# Patient Record
Sex: Female | Born: 1975 | Race: White | Hispanic: Yes | Marital: Single | Smoking: Never smoker
Health system: Southern US, Community
[De-identification: ages and names within clinical notes are randomized; demographics above are authoritative.]

## PROBLEM LIST (undated history)

## (undated) DIAGNOSIS — K219 Gastro-esophageal reflux disease without esophagitis: Secondary | ICD-10-CM

## (undated) DIAGNOSIS — G709 Myoneural disorder, unspecified: Secondary | ICD-10-CM

## (undated) HISTORY — DX: Myoneural disorder, unspecified: G70.9

## (undated) HISTORY — PX: NO PAST SURGERIES: SHX2092

---

## 2003-12-18 ENCOUNTER — Emergency Department (HOSPITAL_COMMUNITY): Admission: EM | Admit: 2003-12-18 | Discharge: 2003-12-19 | Payer: Self-pay | Admitting: Emergency Medicine

## 2004-10-24 ENCOUNTER — Ambulatory Visit: Payer: Self-pay | Admitting: Family Medicine

## 2004-11-02 ENCOUNTER — Encounter: Admission: RE | Admit: 2004-11-02 | Discharge: 2004-11-02 | Payer: Self-pay | Admitting: Sports Medicine

## 2006-12-06 ENCOUNTER — Ambulatory Visit (HOSPITAL_COMMUNITY): Admission: RE | Admit: 2006-12-06 | Discharge: 2006-12-06 | Payer: Self-pay | Admitting: Obstetrics and Gynecology

## 2007-03-13 ENCOUNTER — Ambulatory Visit (HOSPITAL_COMMUNITY): Admission: RE | Admit: 2007-03-13 | Discharge: 2007-03-13 | Payer: Self-pay | Admitting: Family Medicine

## 2007-04-23 ENCOUNTER — Ambulatory Visit: Payer: Self-pay | Admitting: Advanced Practice Midwife

## 2007-04-23 ENCOUNTER — Inpatient Hospital Stay (HOSPITAL_COMMUNITY): Admission: AD | Admit: 2007-04-23 | Discharge: 2007-04-25 | Payer: Self-pay | Admitting: Gynecology

## 2011-04-12 LAB — RPR: RPR Ser Ql: NONREACTIVE

## 2011-04-12 LAB — CBC
HCT: 37.8
Hemoglobin: 12.8
MCHC: 33.9
MCV: 82.4
Platelets: 286
RBC: 4.59
RDW: 15.5 — ABNORMAL HIGH
WBC: 16 — ABNORMAL HIGH

## 2014-04-10 ENCOUNTER — Ambulatory Visit (INDEPENDENT_AMBULATORY_CARE_PROVIDER_SITE_OTHER): Payer: Managed Care, Other (non HMO) | Admitting: Internal Medicine

## 2014-04-10 VITALS — BP 119/74 | HR 82 | Temp 98.6°F | Resp 18 | Wt 150.0 lb

## 2014-04-10 DIAGNOSIS — R202 Paresthesia of skin: Secondary | ICD-10-CM

## 2014-04-10 DIAGNOSIS — E785 Hyperlipidemia, unspecified: Secondary | ICD-10-CM

## 2014-04-10 DIAGNOSIS — M25511 Pain in right shoulder: Secondary | ICD-10-CM

## 2014-04-10 MED ORDER — CYCLOBENZAPRINE HCL 10 MG PO TABS: 10.0000 mg | ORAL_TABLET | Freq: Every day | ORAL | Status: DC

## 2014-04-10 MED ORDER — PREDNISONE 20 MG PO TABS: ORAL_TABLET | ORAL | Status: DC

## 2014-04-10 MED ORDER — MELOXICAM 15 MG PO TABS: 15.0000 mg | ORAL_TABLET | Freq: Every day | ORAL | Status: DC

## 2014-04-10 NOTE — Patient Instructions (Addendum)
Tendinitis del manguito rotador  (Rotator Cuff Tendinitis ) La tendinitis del manguito rotador es la inflamacin de las bandas duras, de aspecto de cordn, que conectan el msculo a los huesos (tendones) en el manguito rotador. El manguito rotador es el conjunto de todos los msculos y tendones que conectan el brazo al hombro. El manguito rotador sostiene la cabeza del hueso del brazo (hmero) en el hueco (fosa) del omplato (escpula). CAUSAS Con frecuencia, la tendinitis del manguito rotador se origina con el uso excesivo de la articulacin involucrada. . Make sure you discuss any questions you have with your health care provider.  SIGNOS Y SNTOMAS  Dolor intenso en el hombro, que se extiende a la parte externa del brazo sobre el msculo del hombro.  Punto de sensibilidad en el rea lesionada.  El dolor aparece gradualmente y empeora al levantar el brazo hacia un lado (abduccin) o al llevarlo Normajean Glasgowhacia adentro (rotacin interna).  Puede producir un desgarro crnico: Cuando el tendn del manguito rotador se inflama, aparece el riesgo de no recibir Designer, fashion/clothingel suministro de Retail buyersangre y Conservator, museum/galleryesto provoca la muerte de las fibras de los tendones. Esto aumenta el riesgo de que el tendn se deshilache o se desgarre por completo. DIAGNSTICO La tendinitis del manguito rotador se diagnostica despus de Automotive engineerhacer la historia clnica, un examen fsico y la revisin de los [place] Lakeresultados de los estudios de diagnstico por imgenes. La historia clnica ayuda a determinar el tipo de lesin en el manguito rotador. El examen fsico incluir evaluar el hombro lesionado, palpar el rea y observarlo mientras hace ejercicios para determinar el rango de Sardismovimiento. Generalmente, se realizan radiografas para descartar otras causas del dolor en el hombro, como fracturas. Suele realizarse una resonancia magntica cuando la lesin en el hombro es significativa. A veces, se realiza un estudio con contraste llamado artrograma por tomografa  computarizada, pero no es tan frecuente como la Health visitorresonancia magntica. En algunas instituciones, tambin pueden usarse ecografas especiales para ayudar a Orthoptistestablecer el diagnstico. TRATAMIENTO  Casos menos graves  Usar un cabestrillo para descansar el hombro durante un breve perodo. El uso prolongado del cabestrillo puede producir rigidez, debilidad y prdida del movimiento de la articulacin del hombro.  Es posible que le receten antinflamatorios, como ibuprofeno o naproxeno. Casos ms graves  Fisioterapia.  Inyecciones de corticoides en la articulacin del hombro.  Ciruga. INSTRUCCIONES PARA EL CUIDADO EN EL HOGAR   Use un cabestrillo o una frula hasta que mejore el dolor. El uso prolongado del cabestrillo puede producir rigidez, debilidad y prdida del movimiento de la articulacin del hombro.  Aplique hielo sobre la zona lesionada.  Ponga el hielo en una bolsa plstica.  Colquese una toalla entre la piel y la bolsa de hielo.  Deje el hielo durante 20 minutos, 2 a 3 veces por da.  Mientras el tendn le cause dolor, evite todo rango de movimiento que no sea moderado. Use el hombro y haga ejercicios solamente segn las indicaciones de su mdico. Suspenda los ejercicios o reduzca el rango de movimiento si el dolor o las molestias Carlinaumentan, a menos que el mdico le indique otra cosa.  Utilice los medicamentos de venta libre o recetados para Primary school teachercalmar el dolor, el malestar o la fiebre, segn se lo indique el mdico.  Si le han inmovilizado el brazo (con un cabestrillo y tirantes), no los retire, excepto que su mdico se lo haya indicado o hasta que vea a su mdico para la visita de control. Si necesita quitarlos, mueva el brazo lo  menos posible.  Podr dormir sobre varias almohadas para disminuir la hinchazn y Chief Technology Officer. SOLICITE ATENCIN MDICA DE INMEDIATO SI:   El dolor en el hombro [place], o siente un nuevo dolor en el brazo, en la mano o en los dedos que no se alivia con  medicamentos.  Presenta sntomas nuevos o desconocidos, especialmente mayor adormecimiento en las manos o prdida de fuerza.  Empeoran los problemas que lo llevaron a la consulta con el mdico.  El brazo, la mano o los dedos estn adormecidos o siente hormigueos.  El brazo, la mano o los dedos estn hinchados, le duelen o se ven blancos o azules ASEGRESE DE QUE:  Comprende estas instrucciones.  Controlar su afeccin.  Recibir ayuda de inmediato si no mejora o si empeora. Document Released: 10/05/2008 Document Revised: 04/09/2013 [facility] Patient Information 2015 [place], [place]. This information is not intended to replace advice given to you by your health care provider. Make sure you discuss any questions you have with your health care provider.    Ejercicios de rango de movimiento del hombro (Shoulder Range of Motion Exercises) El hombro es la articulacin ms flexible del cuerpo humano. Por esta misma razn, es la articulacin ms inestable del cuerpo. Pueden aparecer problemas en el hombro a cualquier edad. Es necesario el tratamiento temprano de estos problemas para obtener un buen resultado. La reaccin de las personas con dolor en el hombro es reducir el movimiento de Nurse, learning disability. Despus de un breve perodo, el hombro puede "congelarse". Esto significa una prdida casi completa de la capacidad para mover el hombro lesionado. Despus de una lesin, el mdico le indicar ejercicios para mantener el rango de movimiento (capacidad para mover libremente el hombro) o para Education officer, community, en caso de haber tenido una reduccin del rango.  EJERCICIOS EJERCICIOS PARA MANTENER LA MOVILIDAD DEL HOMBRO: Ejercicio de Estate agent o del pndulo  Puede hacer este ejercicio en posicin de decbito prono (acostado sobre el vientre) o de pie con el brazo opuesto apoyado sobre una silla. El objetivo es International aid/development worker los msculos del hombro y aumentar el rango de movimiento en forma lenta y segura, a la vez  que se Research scientist (life sciences).  Acustese sobre el vientre, cerca del borde lateral de la cama. Cuelgue el brazo [employer] borde de la cama. Relaje el hombro, el brazo y Engineer, site. Deje que el omplato se relaje y descienda.  Balancee el brazo hacia adelante y [place] atrs en forma lenta y [place]. No use los msculos del cuello, mantngalos relajados. Podra ser ms fcil si otra persona lo ayuda para comenzar a Conservation officer, historic buildings brazo suavemente.  A medida que el dolor disminuye, incremente al movimiento de balanceo. En un principio, balancee el brazo en un ngulo de 15grados. Con [employer], y a medida que [address] dolor, mueva el brazo en un ngulo de [id number]. Comience balanceando el brazo durante unos 15segundos y vaya subiendo el tiempo hasta ejercitar durante 3a .  Tambin puede [employer] ejercicio de pie o inclinado.  Pngase de pie y sostngase en una silla maciza con el brazo sano. Inclnese hacia adelante a la altura de la cintura y doble levemente las rodillas para proteger la espalda. Relaje el brazo lesionado hasta que cuelgue flcidamente. Relaje el omplato y permita que descienda.  Mantenga el hombro relajado y use el movimiento del cuerpo para balancear el brazo en pequeos crculos.  Prese con la cabeza en alto y reljese.  Repita el movimiento y [place] la direccin de  los crculos.  Comience balanceando el brazo durante unos 30segundos y vaya subiendo el tiempo hasta ejercitar durante 3a 5minutos. EJERCICIOS DE ESTIRAMIENTO:  Levante el brazo hacia adelante con el codo doblado a 90grados. Use el otro brazo para tirar suavemente del codo hacia adelante y cruzando el cuerpo.  Doble un brazo por la espalda con la palma hacia afuera. Con el otro brazo, sostenga una toalla o soga y levante el brazo por encima de la cabeza; luego doble el codo para llevar la mueca detrs del cuello. Tome el extremo libre de la toalla con la mano por detrs de la espalda. Tire  suavemente de la toalla hacia arriba con la mano detrs del cuello y aumente el tirn gradualmente con la mano en la parte baja de la espalda. Despus tire gradualmente hacia abajo con la mano en la parte baja de la espalda. As se estirarn an ms la mano y el brazo detrs del cuello. Ambos hombros lograrn un mayor rango de movimiento con la repeticin de este ejercicio. EJERCICIOS DE FORTALECIMIENTO:  De pie, con el brazo estirado a un costado y y el codo doblado a 90grados, sostenga una pesa pequea y levante la mano lentamente apuntando [place]hacia arriba. En un principio, repita este movimiento cinco veces y aumente gradualmente hasta llegar a diez veces. Realice esto cuatro veces por da. A medida que tenga ms fuerza, aumente Tyson Foodsgradualmente el peso.  Repita este ejercicio, pero esta vez use una banda elstica. Comience con la mano levantada y tire hacia abajo hasta que la mano est a un costado. A medida que tenga ms fuerza, aumente gradualmente la fuerza que [place], ya sea usando ms bandas elsticas o con una banda de mayor tamao. Haga la misma cantidad de repeticiones.  De pie, con la mano a un costado y sosteniendo una pesa, levante una mano gradualmente hacia adelante hasta que supere la cabeza. Repita este ejercicio tambin con la mano a un costado, levntela alejndola del cuerpo hasta que supere la cabeza. En un principio, repita este movimiento cinco veces y aumente gradualmente hasta llegar a diez veces. Realice esto cuatro veces por da. A medida que tenga ms fuerza, aumente Tyson Foodsgradualmente el peso. Document Released: 04/09/2013 Document Revised: 06/24/2013 [facility] Patient Information 2015 [place], [place]. This information is not intended to replace advice given to you by your health care provider. Make sure you discuss any questions you have with your health care provider.

## 2014-04-10 NOTE — Progress Notes (Addendum)
Subjective:    Patient ID: Julie Herman, female    DOB: 10/30/1975, 38 y.o.   MRN: [id number] This chart was scribed for Ellamae Siaobert Emelina Hinch, MD by Littie Deedsichard Sun, Medical Scribe. This patient was seen in Room 5 and the patient's care was started at 3:30 PM.   Patient history translated by relative HPI HPI Comments: Julie Herman is a 38 y.o. female who presents to the [facility] complaining of gradual onset, constant right shoulder pain that radiates down her arm and neck that began a few days ago. Patient says the pain started in her elbow. She also notes numbness in her fingers of the right hand. For her job, she moves things around constantly. She is able to work with the pain, but she states the pain is worse at night when not moving. Patient has been taking Ibuprofen for the pain, but without relief to symptoms. She notes that her right arm feels numb and tight, as if it is about to burst, after waking up and that she has not been able to brush her teeth or eat; however, she says she her shoulder is better after relaxing it for a bit and then she can carry on with her daily activities. She notes having an injury in her right thumb 1 year ago that she still has pain from. Patient does not recall any injuries to her shoulder in the past week.   Patient has hx of HL, which she takes medication for. She has seen a cardiologist in another network before, who has told her that her blood is thicker than normal. She was sent to an orthopedic specialist by her work, but she was told by the orthopedic specialist to see a PCP first.  On a ? statin  Review of Systems  Constitutional: Negative for fever, activity change, appetite change, fatigue and unexpected weight change.  Eyes: Negative for visual disturbance.  Respiratory: Negative for choking, chest tightness, shortness of breath and wheezing.   Cardiovascular: Negative for palpitations and leg swelling.  Gastrointestinal:  Negative for abdominal pain.        Objective:   Physical Exam  Nursing note and vitals reviewed. Constitutional: She is oriented to person, place, and time. She appears well-developed and well-nourished. No distress.  HENT:  Head: Normocephalic and atraumatic.  Mouth/Throat: Oropharynx is clear and moist. No oropharyngeal exudate.  Eyes: Pupils are equal, round, and reactive to light.  Neck: Normal range of motion. Neck supple.  Pulmonary/Chest: Effort normal.  Musculoskeletal: She exhibits tenderness. She exhibits no edema.  Tenderness to palpation in the right trapezius, right scapular border, right rotator cuff generally, right bicipital groove; range of motion good except pain with external rotation and abduction with resistance; ROM elbow is normal without tenderness; grip is full; there is tenderness at the first [facility] joint without chronic changes and ROM is good  Neurological: She is alert and oriented to person, place, and time. No cranial nerve deficit.  Skin: Skin is warm and dry. No rash noted.  Psychiatric: She has a normal mood and affect. Her behavior is normal.          Assessment & Plan:    Hyperlipidemia by hx  Right shoulder pain-nocturnal mainly, with transient Paresthesias---RC tendonitis suspected  Meds ordered this encounter  Medications  . meloxicam (MOBIC) 15 MG tablet    Sig: Take 1 tablet (15 mg total) by mouth daily.    Dispense:  30 tablet    Refill:  0  . cyclobenzaprine (FLEXERIL) 10 MG tablet    Sig: Take 1 tablet (10 mg total) by mouth at bedtime.    Dispense:  30 tablet    Refill:  0  . predniSONE (DELTASONE) 20 MG tablet    Sig: 3/3/2/2/1/1 single daily dose for 6 days for inflammation    Dispense:  12 tablet    Refill:  0   Exercises given If not better=PT and then if no response, xrays or ref to ortho for US or xrays and further rx Will schedule f/u 3 weeks at 89 she prefers spanish speaking provider She is to bring her labs for  further review and the med she is on

## 2014-08-01 ENCOUNTER — Ambulatory Visit (INDEPENDENT_AMBULATORY_CARE_PROVIDER_SITE_OTHER): Payer: Managed Care, Other (non HMO) | Admitting: Family Medicine

## 2014-08-01 VITALS — BP 106/68 | HR 64 | Temp 98.3°F | Ht 60.0 in | Wt 149.0 lb

## 2014-08-01 DIAGNOSIS — IMO0002 Reserved for concepts with insufficient information to code with codable children: Secondary | ICD-10-CM

## 2014-08-01 DIAGNOSIS — M7712 Lateral epicondylitis, left elbow: Secondary | ICD-10-CM

## 2014-08-01 DIAGNOSIS — M2550 Pain in unspecified joint: Secondary | ICD-10-CM

## 2014-08-01 DIAGNOSIS — N3000 Acute cystitis without hematuria: Secondary | ICD-10-CM

## 2014-08-01 DIAGNOSIS — M791 Myalgia: Secondary | ICD-10-CM

## 2014-08-01 DIAGNOSIS — K219 Gastro-esophageal reflux disease without esophagitis: Secondary | ICD-10-CM

## 2014-08-01 DIAGNOSIS — N941 Dyspareunia: Secondary | ICD-10-CM

## 2014-08-01 DIAGNOSIS — IMO0001 Reserved for inherently not codable concepts without codable children: Secondary | ICD-10-CM

## 2014-08-01 DIAGNOSIS — Z0000 Encounter for general adult medical examination without abnormal findings: Secondary | ICD-10-CM

## 2014-08-01 DIAGNOSIS — M609 Myositis, unspecified: Secondary | ICD-10-CM

## 2014-08-01 LAB — CBC
HCT: 39.5 % (ref 36.0–46.0)
Hemoglobin: 13.3 g/dL (ref 12.0–15.0)
MCH: 27.6 pg (ref 26.0–34.0)
MCHC: 33.7 g/dL (ref 30.0–36.0)
MCV: 82 fL (ref 78.0–100.0)
MPV: 10.5 fL (ref 8.6–12.4)
Platelets: 349 10*3/uL (ref 150–400)
RBC: 4.82 MIL/uL (ref 3.87–5.11)
RDW: 14.2 % (ref 11.5–15.5)
WBC: 8.3 10*3/uL (ref 4.0–10.5)

## 2014-08-01 LAB — COMPREHENSIVE METABOLIC PANEL
ALT: 24 U/L (ref 0–35)
AST: 21 U/L (ref 0–37)
Albumin: 3.9 g/dL (ref 3.5–5.2)
Alkaline Phosphatase: 86 U/L (ref 39–117)
BUN: 9 mg/dL (ref 6–23)
CO2: 24 mEq/L (ref 19–32)
Calcium: 9.3 mg/dL (ref 8.4–10.5)
Chloride: 103 mEq/L (ref 96–112)
Creat: 0.59 mg/dL (ref 0.50–1.10)
Glucose, Bld: 78 mg/dL (ref 70–99)
Potassium: 4.4 mEq/L (ref 3.5–5.3)
Sodium: 136 mEq/L (ref 135–145)
Total Bilirubin: 0.7 mg/dL (ref 0.2–1.2)
Total Protein: 7.3 g/dL (ref 6.0–8.3)

## 2014-08-01 LAB — POCT URINALYSIS DIPSTICK
Bilirubin, UA: NEGATIVE
Glucose, UA: NEGATIVE
Ketones, UA: NEGATIVE
Leukocytes, UA: NEGATIVE
Nitrite, UA: NEGATIVE
Protein, UA: NEGATIVE
Spec Grav, UA: 1.02
Urobilinogen, UA: 0.2
pH, UA: 6

## 2014-08-01 LAB — POCT UA - MICROSCOPIC ONLY
Bacteria, U Microscopic: NEGATIVE
Casts, Ur, LPF, POC: NEGATIVE
Crystals, Ur, HPF, POC: NEGATIVE
Mucus, UA: NEGATIVE
Yeast, UA: NEGATIVE

## 2014-08-01 LAB — POCT WET PREP WITH KOH
KOH Prep POC: NEGATIVE
Trichomonas, UA: NEGATIVE
Yeast Wet Prep HPF POC: NEGATIVE

## 2014-08-01 LAB — VITAMIN B12: Vitamin B-12: 493 pg/mL (ref 211–911)

## 2014-08-01 LAB — LIPID PANEL
Cholesterol: 197 mg/dL (ref 0–200)
HDL: 49 mg/dL (ref >39)
LDL Cholesterol: 107 mg/dL — ABNORMAL HIGH (ref 0–99)
Total CHOL/HDL Ratio: 4 Ratio
Triglycerides: 203 mg/dL — ABNORMAL HIGH (ref <150)
VLDL: 41 mg/dL — ABNORMAL HIGH (ref 0–40)

## 2014-08-01 LAB — C-REACTIVE PROTEIN: CRP: <0.5 mg/dL (ref <0.60)

## 2014-08-01 LAB — POCT GLYCOSYLATED HEMOGLOBIN (HGB A1C): Hemoglobin A1C: 5.5

## 2014-08-01 LAB — TSH: TSH: 0.687 u[IU]/mL (ref 0.350–4.500)

## 2014-08-01 MED ORDER — NAPROXEN 500 MG PO TABS: 500.0000 mg | ORAL_TABLET | Freq: Two times a day (BID) | ORAL | Status: DC

## 2014-08-01 MED ORDER — OMEPRAZOLE 40 MG PO CPDR: 40.0000 mg | DELAYED_RELEASE_CAPSULE | Freq: Every day | ORAL | Status: DC

## 2014-08-01 MED ORDER — CYCLOBENZAPRINE HCL 10 MG PO TABS: 10.0000 mg | ORAL_TABLET | Freq: Every day | ORAL | Status: DC

## 2014-08-01 NOTE — Patient Instructions (Addendum)
Clarinda (Health Maintenance) Adoptar un estilo de vida saludable y recibir atencin preventiva pueden ser de suma utilidad para promover la salud y Musician. Hable con el mdico para saber cul es el esquema de exmenes peridicos adecuado para usted. Esta es una buena oportunidad para Teacher, adult education peridicamente al mdico sobre cmo prevenir enfermedades y [place] sano. Entre cada control mdico, hay muchas cosas que puede hacer por s solo. Los expertos han investigado mucho acerca de los cambios en el estilo de vida y las medidas preventivas que muy probablemente preserven su salud. Consulte al mdico para obtener ms informacin. EL PESO Y LA DIETA  Consuma una dieta saludable.  Incluya abundante cantidad de verduras, frutas, productos lcteos descremados y protenas magras.  No coma muchos alimentos con alto contenido de grasas slidas, azcares agregados o sal.  Realice actividad fsica con regularidad. Esta es una de las cosas ms importantes que puede hacer por su salud.  La State Farm de las personas adultas deben hacer actividad fsica durante por lo menos 140mnutos semanales. El ejercicio debe aumentar la frecuencia cardaca y hNature conservation officersudar (ejercicio de intensidad moderada).  Adems, casi todos los adultos deben hacer ejercicios de fortalecimiento al mToysRusveces por semana como complemento del ejercicio de iDante Mantenga un peso saludable.  El ndice de masa corporal ([facility] es una medida que puede usarse para identificar posibles problemas relacionados con el peso. Este ofrece un clculo estimativo de la gAir traffic controlleren funcin del peso y lAgricultural consultant El mdico puede determinar su IEncino Outpatient Surgery Center LLCy ayudarlo a aScience writery mTheatre managerun peso saludable.  Para las mujeres mayores de 20aos:  Un ISurgicare Surgical Associates Of Mahwah LLCmenor de 18,5 se considera bajo peso.  Un IBaptist Health Medical Center - North Little Rockentre 18,5 y 24,9 es normal.  Un ITerrell State Hospitalentre 25 y 29,9 es sobrepeso.  Un IMC de 30 o ms se considera  obesidad. Controlar los niveles de colesterol y lpidos en la sangre  Debe comenzar a hacerse anlisis de sangre para controlar los nAscutneyde lpidos y colesterol a partir de lSedalia y repetir estos estudios cada 5aos.  Tal vez deba someterse a controles de los niveles de colesterol con ms frecuencia si:  Tiene los niveles de lpidos o colesterol elevados.  Es mayor de 50aos.  Tiene un riesgo alto de tener enfermedades cardacas. DETECCIN DE CNCER  Cncer de pulmn  Se recomienda realizar exmenes de deteccin de cncer de pulmn a las personas adultas que tienen entre 581y 80aos, y corren riesgo de tBest boycncer de pulmn debido a sus antecedentes de tabaquismo.  Se recomienda realizar una tomografa computarizada anual de baja dosis de los pulmones a las personas que:  Siguen fumando.  Hayan dejado de fumar en los ltimos 15aos.  Hayan fumado un paquete diario durante 30aos. El ndice ao-paquete equivale a fumar, en promedio, un paquete de cigarrillos diario durante 1ao.  Debe seguir realizndose estudios de deteccin anual hasta que hayan pasado 15aos desde que dej de fumar.  Estos estudios deben suspenderse si tiene un problema de salud que le impedira recibir tratamiento para eScience writerde pulmn. Cncer de mama  Ponga en prctica la "autoconciencia de las mamas". Esto significa reconocer la apariencia normal de las mamas y cSt. [place]  Adems implica realizarse autoexmenes peridicos de lJohnson & Johnson Informe al mdico si hay algn cambio, sin importar cun pequeo sea.  Si tiene entre 20 y 30aos, un mdico debe hacerle un examen clnico de las mamas cada 1 a 316aoscomo parte del  examen habitual de salud.  Si es mayor de 40aos, debe Electrical engineer un examen clnico de las Microsoft. Tambin debe considerar la posibilidad de realizarse una radiografa de las mamas ([place]) todos los [place].  Si tiene antecedentes familiares de cncer de  mama, hable con el mdico para saber si debe someterse a un estudio gentico.  Si tiene un riesgo alto de Animal nutritionist de mama, hable con el mdico para saber si debe hacerse una resonancia magntica y 3M Company.  Se recomienda una evaluacin del gen del cncer de mama (BRCA) a las mujeres que tengan familiares con tumores malignos relacionados con el BRCA. Los tumores malignos relacionados con elBRCA incluyen:  [employer]state.  Los Avaya.  Los tumores malignos del peritoneo.  Los resultados de la evaluacin determinarn la necesidad de asesoramiento gentico y de [place] de BRCA1 y BRCA2. Cncer de cuello uterino Ya no se recomiendan los exmenes plvicos de rutina para la deteccin del cncer de cuello uterino en las mujeres que no estn embarazadas que son consideradas sujetos de bajo riesgo de Best boy cncer de los rganos de la pelvis (ovarios, tero y vagina) y que no tienen sntomas. Tal vez sea necesario realizar un examen plvico si tiene sntomas, incluidos aquellos que estn asociados con infecciones en la pelvis. Pregntele al mdico si un examen plvico de deteccin es adecuado para usted.   El Papanicolau es la prueba de deteccin del cncer de cuello uterino para las mujeres que podran Engineer, production.  Si le han realizado una histerectoma por un problema que no era cncer u otra enfermedad que podra causar cncer, ya no necesitar realizarse pruebas de Papanicolaou.  Si es mayor de 39aos y los resultados de las pruebas de Papanicolaou han sido normales durante los ltimos 10aos, ya no es necesario que se realice estos estudios.  Si ha recibido un tratamiento para el cncer de cuello uterino o para una enfermedad que podra causar cncer, necesitar realizarse una prueba de Papanicolaou y controles durante al menos 26 aos de concluido el [place].  Si ya no se realiza pruebas de Papanicolaou, debe evaluar sus factores de riesgo si estos  se modifican (por ejemplo, tiene un nuevo compaero sexual). Esta situacin puede influir en la necesidad de que se someta nuevamente a estudios de deteccin.  Algunas mujeres sufren problemas mdicos que aumentan la probabilidad de tener cncer de cuello uterino. En este caso, el mdico podr indicarle que se someta a exmenes de deteccin y pruebas de Papanicolaou con ms frecuencia.  La prueba del virus del Engineer, technical sales (VPH) es un estudio adicional que puede usarse para la deteccin del cncer de cuello uterino. Esta prueba busca la presencia del virus que puede causar cambios celulares en el cuello del tero. Las clulas que se recolectan durante la prueba de Papanicolaou pueden usarse para el VPH.  La prueba del VPH puede usarse para examinar a las Cendant Corporation de [id number]. Someterse a International aid/development worker del VPH puede prolongar el Temple-Inland las pruebas de Papanicolaou normales de tres a Product manager.  Adems, se debe realizar la prueba del VPH para evaluar a las mujeres de cualquier edad cuyos resultados del Papanicolau no sean claros.  Despus de los 30aos, las mujeres deben realizarse pruebas del VPH con la misma frecuencia que las pruebas de Papanicolau. Cncer colorrectal  Es posible detectar este tipo de cncer y, a menudo, es posible prevenirlo.  Generalmente, los estudios de deteccin de  rutina del cncer colorrectal empiezan a hacerse a Proofreader de los 44aos y United States Steel Corporation [phone].  El mdico puede recomendar que se los haga antes, si tiene factores de riesgo de cncer de colon.  Adems, el mdico puede recomendar que use un kit de prueba casera para hallar sangre oculta en la materia fecal.  Es posible que se use una pequea cmara en el extremo de un tubo para examinar directamente el colon (sigmoidoscopa o colonoscopa), con el fin de Hydrographic surveyor las formas ms incipientes de Surveyor, minerals.  Generalmente, los estudios de deteccin de rutina se Insurance underwriter  a Proofreader de [place].  El examen directo del colon debe repetirse cada 5 a 10aos hasta cumplir 75aos. Sin embargo, tal vez deba someterse a la prueba de deteccin con ms frecuencia si se encuentran formas incipientes de plipos precancerosos o pequeos tumores. Cncer de piel  Revsese la piel peridicamente desde los dedos de los pies hasta la cabeza.  Informe al mdico si aparecen nuevos lunares o si nota cambios en los que ya tiene, especialmente en la forma o el color.  Tambin notifquele si tiene un lunar cuyo tamao es ms grande que el de la goma de un lpiz.  Use siempre pantalla solar. Aplique pantalla solar tantas veces como pueda a lo largo del da.  Protjase usando mangas y pantalones largos, un sombrero de ala ancha y gafas para el sol todo [place], siempre que est al [place]. ENFERMEDADES CARDACAS, DIABETES E HIPERTENSIN ARTERIAL   Debe controlarse la presin arterial al menos cada 1 o 2aos. La hipertensin arterial causa enfermedades cardacas y [place] el riesgo de ictus.  Si tiene entre 7 y 79aos, consulte al mdico si debe tomar aspirina para prevenir ictus.  Hgase anlisis peridicos para la diabetes, que incluyen la toma de [place] de sangre para Freight forwarder nivel de azcar en la sangre mientras est en ayunas.  Si su peso es normal y tiene un riesgo bajo de tener diabetes, hgase este anlisis una vez cada tres aos, despus de los 45aos.  Si tiene sobrepeso y un riesgo alto de sufrir diabetes, considere la posibilidad de [employer] a una edad ms temprana o con ms frecuencia. PREVENCIN DE INFECCIONES  Hepatitis B  Si tiene un riesgo ms alto de tener hepatitisB, debe hacerse anlisis de deteccin de [place] virus. Se considera que tiene un alto riesgo de hepatitis B si:  Naci en un pas donde la hepatitisB es frecuente. Pregntele al mdico qu pases son considerados de Public affairs consultant.  Sus padres nacieron en un pas de alto  riesgo, y usted no recibi la vacuna contra la hepatitis B.  [place].  [place] agujas para inyectarse drogas.  Convive con una persona que tiene hepatitisB.  Tuvo relaciones sexuales con una persona que tiene hepatitisB.  Recibe tratamiento de hemodilisis.  Toma ciertos medicamentos para cuadros clnicos tales Civil engineer, contracting, trasplante de  Osteoartritis (Osteoarthritis) La osteoartritis es una enfermedad que provoca dolor e inflamacin en las articulaciones. Ocurre cuando el cartlago de la articulacin afectada se desgasta. El cartlago acta como una almohadilla que cubre los extremos de los huesos que forman una articulacin. La osteoartritis es la ms frecuente de reumatismo articular. Afecta a menudo a los ancianos. Las articulaciones que se ven ms afectadas por esta afeccin son las que se encuentran en las siguientes zonas:  Los extremos de los dedos.  Los pulgares.  El cuello.  La parte inferior de la espalda.  Las rodillas.  Las caderas CAUSAS  Con el paso del [place], el cartlago que recubre los extremos de los huesos comienza a IT sales professional. Esto provoca friccin Monsanto Company, lo que causa dolor y entumecimiento en las articulaciones afectadas.  [place] probabilidades de padecer osteoartritis, incluidos los siguientes:  Edad avanzada.  Exceso de Engineer, site.  Uso excesivo de la articulacin. [place] y entumecimiento en la articulacin.  Con el tiempo, la articulacin pierde su forma normal.  Pueden formarse pequeos depsitos de hueso (ostefitos) en los extremos de Water engineer.  Algunos trozos de Praxair o cartlago pueden separarse y flotar dentro del espacio de la articulacin. Esto puede causar ms dolor y lesiones. DIAGNSTICO  El mdico le preguntar acerca de sus sntomas y le har un examen fsico. Le indicarn varios estudios, como:  Radiografas de Associate Professor.  Una resonancia magntica (RM).  Anlisis de sangre para descartar otros tipos de artritis.  Anlisis de los fluidos de Water engineer. Para ello se utiliza una aguja para extraer lquido de la articulacin y examinarlo en el microscopio. TRATAMIENTO  Los Berkshire Hathaway del tratamiento son Financial controller y mejorar el funcionamiento de Water engineer. Los planes de tratamiento pueden incluir lo siguiente:  Un programa de ejercicios recomendado que permita el descanso y el alivio de la articulacin.  Un plan de control del peso.  Tcnicas de UnumProvident, como las siguientes:  Aplicacin correcta de fro y Freight forwarder.  Impulsos elctricos enviados a las terminaciones nerviosas que se encuentran debajo de la piel (neuroestimulacin elctrica transcutnea [TENS, por sus siglas en ingls]).  Masajes.  Ciertos suplementos nutricionales.  Medicamentos para Financial controller como:  Paracetamol.  Antiinflamatorios no esteroides (AINE), como el naproxeno.  Narcticos o agentes de accin central, como el tramadol.  Corticoides. Estos se pueden administrar por va oral o mediante una inyeccin.  Ciruga para reposicionar los Affiliated Computer Services y Best boy (osteotoma) o para retirar las piezas sueltas de hueso y Database administrator. Puede ser necesario el reemplazo de las articulaciones en estadios avanzados de la enfermedad. [place] los medicamentos solamente como se lo haya indicado el mdico.  Mantenga un peso saludable. Siga las instrucciones del mdico con respecto al control del [place]. Esto puede incluir instrucciones Recruitment consultant.  Practique los ejercicios que le indiquen. Es posible que el mdico le recomiende tipos especficos de ejercicios. Estos pueden incluir:  Ejercicios de fortalecimiento Se realizan para fortalecer los Bank of New York Company sostienen las articulaciones afectadas por la artritis. Pueden realizarse con peso o con  bandas para agregar resistencia.  Actividades Precious Haws. Son Clinical research associate a paso ligero, gimnasia [place] de bajo impacto, que acelere el corazn.  Actividades de amplitud de movimientos. Dan agilidad a las articulaciones.  Ejercicios de equilibrio y [place]. Ayudan a Advanced Micro Devices se necesitan para la vida diaria.  Haga descansar a las articulaciones segn las indicaciones del mdico.  Concurra a todas las visitas de control como se lo haya indicado el mdico. SOLICITE ATENCIN MDICA SI:   La piel se pone roja.  Aparece una erupcin adems del dolor en la articulacin.  El dolor en la articulacin empeora.  Tiene fiebre y siente dolor en la articulacin o el msculo. SOLICITE ATENCIN MDICA DE INMEDIATO SI:  Nota una prdida importante de peso o del apetito.  Tiene transpiracin nocturna. Marthenia Rolling MS INFORMACIN  [place] Artritis y Arboriculturist Musculoesquelticas y Insurance underwriter [facility] of Arthritis and Musculoskeletal and Skin Diseases): www.niams.[email or web address].es.  [place] (Lock[employer] on Aging): http://kim-miller.com/.  Instituto Norteamericano de Research officer, political party of Rheumatology): www.rheumatology.org. Document Released: 03/29/2005 Document Revised: 11/03/2013 ExitCare Patient Information 2015 [place], [place]. This information is not intended to replace advice given to you by your health care provider. Make sure you discuss any questions you have with your health care provider. Dieta para el control del colesterol y las grasas  (Fat and Cholesterol Control Diet) Los niveles de grasa y colesterol en la sangre y en los rganos se ven influidos por la dieta. Los AutoZone de grasa y Research officer, trade union pueden conducir a enfermedades del Training and development officer, de los pequeos y los grandes vasos sanguneos, de la vescula biliar, el hgado y el pncreas.  CONTROL DE LA GRASA Y EL COLESTEROL CON LA  DIETA  Aunque el ejercicio y el estilo de vida son factores importantes, su dieta es la clave. Esto se debe a que se sabe que ciertos alimentos hacen subir el colesterol y otros lo [place]. El objetivo debe ser Commercial Metals Company alimentos, de modo que tengan un efecto sobre el colesterol y, an ms importante, Training and development officer las grasas saturadas y trans con otros tipos de grasas, como las monoinsaturadas y las poliinsaturadas y cidos grasos omega-3.  En promedio, una persona no debe consumir ms de 15 a 17 g de grasas saturadas por SunTrust. Las grasas saturadas y trans se consideran grasas "malas", ya que elevan el colesterol LDL. Las grasas saturadas se encuentran principalmente en productos animales como carne, [place] y crema. Sin embargo, eso no significa que tenga que renunciar a todas sus comidas favoritas. Actualmente, hay buenos sustitutos bajos en colesterol, bajos en grasas para la State Farm de las cosas que le gusta comer. Elija aquellos alimentos alternativos que sean bajos en grasas o sin grasas. Elija cortes de peceto o lomo de carne roja. Estos tipos de cortes contienen menos grasa y colesterol. Pollo (sin la piel), pescado, ternera y [place] de [place] molida son excelentes opciones. Eliminar las carnes grasas, como las salchichas y [place]. Los mariscos contienen poca o casi nada de grasas saturadas. Consuma una porcin de 3 oz (85 g) de carne magra, aves o pescado.  Las grasas trans tambin se llaman "aceites parcialmente hidrogenados". Son aceites manipulados cientficamente de [place] que son slidos a Engineer, water, tienen una larga vida y Therapist, art sabor y la textura de los alimentos a los que se Pension scheme manager. Las grasas trans se encuentran en la [place], [place], crackers y alimentos horneados.  Al hornear y Audiological scientist, el aceite es un buen sustituto de la [place]. Los aceites monoinsaturados son beneficiosos, sobre todo porque se cree que reducen el colesterol LDL y aumentan el HDL. Los aceites que hay  que evitar completamente son los aceites tropicales saturados, como el de coco y palma.  Recuerde consumir una gran cantidad de alimentos de los grupos que estn naturalmente libres de grasas saturadas y grasas trans, e incluya pescado, frutas, verduras, frijoles, granos (cebada, arroz, cuscs, trigo bulgur) y pastas (sin salsas de crema).  IDENTIFICACIN DE LOS ALIMENTOS QUE REDUCEN LAS GRASAS Y ELCOLESTEROL  La fibra soluble puede reducir el colesterol. Este tipo de [place] se Firefighter en [place], verduras como el [place], papas y [place], las legumbres Franklin Resources frijoles, guisantes y Engineer, technical sales y granos como la cebada. Los alimentos enriquecidos con Dentist (fitosteroles) tambin pueden reducir Freight forwarder.  Consuma al menos 2 g por da de estos alimentos para un efecto de disminucin del colesterol.  Lea las etiquetas de los paquetes para identificar los alimentos bajos en grasas saturadas, en grasas trans y los bajos en grasas en el supermercado. Seleccione los Constellation Energy tienen slo 2 a 3 g de grasa saturada por onza. Utilice margarina saludable para el corazn que sea [place] de grasas trans o aceites parcialmente hidrogenados. Al comprar productos de panadera (galletas, crackers), se deben evitar los aceites parcialmente hidrogenados. Panes y panecillos deben hacerse con cereales integrales (trigo integral o harina de avena integral en lugar de " harina " o " harina enriquecida ") Compre sopas en lata que no sean cremosas, con bajo contenido de sal y sin grasas adicionadas.  TCNICAS DE PREPARACIN DE LOS ALIMENTOS  Nunca prepare los alimentos fritos. Si usted debe frer, Tesoro Corporation en muy poca grasa o use un aerosol de cocina anti adherente. Siempre que sea posible, debe hervir, hornear o asar las carnes y preparar las verduras al vapor. En lugar de agregar mantequilla o margarina a las verduras, use limn y hierbas, pur de [place] y canela (para la calabaza y la  batata). Utilice yogur natural sin grasa, salsas y aderezos bajos en grasa para ensaladas.  BAJO EN GRASAS SATURADAS / SUSTITUTOS BAJOS EN GRASA  Carnes / grasas saturadas (g)  Evite: Bife, veteado (3 oz/85 g) / 11 g  Elija: Bife, magro(3 oz/85 g) / 4 g  Evite: Hamburguesa (3 oz/85 g) / 7 g  Elija: Hamburguesa, magra (3 oz/85 g) / 5 g  Evite: Jamn (3 oz/85 g) / 6 g  Elija: Jamn, corte magro (3 oz/85 g) / 2,4 g  Evite: Pollo con piel, carne oscura (3 oz/85 g) / 4 g  Elija: Pollo, sin piel, carne oscura (3 oz/85 g) / 2 g  Evite: Pollo con piel, carne blanca (3 oz/85 g) / 2,5 g  Elija: Pollo, sin piel, carne blanca (3 oz/85 g) / 1 g Lcteos / Grasa saturada (g)   Evite: Leche entera (1 taza) / 5 g  Elija: Leche descremada, 2% (1 taza) / 3 g  Elija: Leche descremada, 1% (1 taza) / 1,5 g  Elija: Leche descremada, 1 taza (0,3 g).  Evite: Queso duro (1 oz/28 g) / 6 g  Elija: Queso de USG Corporation (1 oz/28 g) / 2 a 3 g  Evite: Queso cottage, 4% de grasa (1 taza) / 6,5 g  Elija: Queso cottage bajo en grasa, 1% de grasa (1 taza) / 1,5 g  Evite: Helado (1 taza) / 9 g  Elija: Sorbete (1 taza) / 2,5 g  Elija: Yogur congelado descremado (1 taza) / 0,3 g  Elija: Barra de frutas congeladas / trace  Evite: Crema batida (1 cucharada) / 3,5 g  Elija: Crema batida no lctea (1 cucharada) / 1 g Condimentos / Grasas Saturadas (g)   Evite: Mayonesa (1 cucharada) / 2 g  Elija: Mayonesa baja en grasa (1 cucharada) / 1 g  Evite: Mantequilla (1 cucharada) / 7 g  Elija: Margarina light extra (1 cucharada) / 1 g  Evite: Aceite de coco (1 cucharada) / 11,8 g  Elija: Aceite de oliva (1 cucharada) / 1,8 g  Elija: Aceite de maz (1 cucharada) / 1,7 g  Elija: Aceite de crtamo (1 cucharada) / 1,2 g  Elija: Aceite de girasol (1 cucharada) / 1,4 g  Elija: Aceite de soja (1 cucharada) / 0 mg / 2,4 g  Elija: Aceite de canola (1 cucharada) / 0 mg / 1 g Document Released:  06/19/2005 Document Revised: 10/14/2012 [facility] Patient Information 2015 [place], [place]. This information is not intended to replace advice given to you by your health care provider. Make sure you discuss any questions you have with your health care provider. Codo de [place] Electronics engineer) El profesional que lo asiste le ha diagnosticado una enfermedad denominada "codo de [place]". Esto es el resultado de pequeos desgarros o un resentimiento (inflamacin) al comienzo (origen) del msculo extensor del Management consultant. Aunque la enfermedad a menudo se denomina codo de [place] o de [place], est ocasionada por una accin repetida realizada con el codo. INSTRUCCIONES PARA EL CUIDADO DOMICILIARIO  Si la enfermedad recin aparece, el descanso ser el nico tratamiento requerido. Utilizar el otro brazo para Customer service manager tarea podr ser de [place]. Incluso cambiar el agarre puede ayudar a descansar la extremidad. Esto tambin podr prevenir que la enfermedad sea recurrente.  Los problemas de largo plazo, sin embargo, podrn aliviarse ms rpido mediante:  La utilizacin de Nutritional therapist.  La aplicacin de bolsas con hielo por 30 minutos luego del da laboral, a la hora de dormir, o cuando se han finalizado las [place].  El mdico tambin podr recomendarle una tablilla o cabestrillo. Esto permitir que el tendn inflamado se cure. A veces, se requerirn inyecciones de esteroides junto con anestesia local y un entablillado por 1 a 2 semanas. Dos o tres inyecciones de esteroides a menudo resuelven el problema. En algunos casos de Nationwide Mutual Insurance, el tendn inflamado no responde a la terapia conservadora (no [place]). Otras veces puede ser necesaria la [place]. EST SEGURO QUE:   Comprende las instrucciones para el alta mdica.  Controlar su enfermedad.  Solicitar atencin mdica de inmediato segn las indicaciones. Document Released: 06/19/2005 Document Revised: 09/11/2011 [facility]  Patient Information 2015 [place]. This information is not intended to replace advice given to you by your health care provider. Make sure you discuss any questions you have with your health care provider.  Epicondilitis lateral (codo de tenista) con rehabilitacin (Lateral Epicondylitis [Tennis Elbow] with Rehab) La epicondilitis lateral consiste en la inflamacin y dolor alrededor de la regin externa del codo. El dolor tiene su origen en la inflamacin de los tendones del antebrazo que extienden la [place]. La epicondilitis lateral, tambin es denominada codo de [place] debido a que es muy frecuente TXU Corp jugadores de tenis. Sin embargo, Administrator, Civil Service individuo que extienda la mueca de [place] repetitiva. Si esta afeccin no se trata, puede transformarse en un problema crnico. SNTOMAS  Dolor y sensibilidad e inflamacin en la zona externa (lateral) del codo.  Dolor o debilidad al tomar Winn[employer]Dixie.  Dolor que [place] con los movimientos de rotacin de la mueca (jugar al tenis, usar un Information systems manager, abrir una puerta o un frasco).  Dolor al levantar objetos, inclusive [place] taza de caf. CAUSAS  La causa de la epicondilitis lateral es la inflamacin de los tendones que extienden la [place]. Entre las causas se incluyen:  Psychologist, forensic repetitivo y distensin de los msculos y los tendones que extienden la [place].  Cambio repentino en el nivel o intensidad de la [place].  Agarre incorrecto en los deportes con raqueta.  Tamao incorrecto del puo de la raqueta (con frecuencia demasiado grande).  Posicin o tcnica incorrecta al [employer] un golpe (generalmente con el dorso de la mano; llevada por el codo).  Utilizar una raqueta demasiado pesada. LOS RIESGOS AUMENTAN CON:  Los deportes u ocupaciones  que requieren movimientos repetitivos y extenuantes del antebrazo y la [place] (tenis, squash, deportes con raqueta, carpintera).  Poca fuerza y flexibilidad de la [place] y  [place].  Precalentamiento y elongacin inadecuados antes de la [place].  Regreso a la actividad antes de [employer] curacin, la rehabilitacin y Museum/gallery exhibitions officer. PREVENCIN  Precalentamiento adecuado y elongacin antes de la [place].  Mantener la forma fsica:  Kerry Hough, flexibilidad y resistencia muscular.  Capacidad cardiovascular.  Utilice un equipo que le ajuste adecuadamente.  Usar la tcnica correcta y Best boy un entrenador que corrija la tcnica incorrecta.  Use un vendaje para el codo apropiado para el tenis (contrafuerza). PRONSTICO El curso de la enfermedad depende del grado de la lesin. Si se trata adecuadamente, los casos agudos (sntomas que duran menos de 4 semanas) generalmente se resuelven en un perodo de 2 a 6 semanas. Los casos crnicos (que duran ms tiempo) se resuelven en un lapso de 3 a 6 meses, pero pueden requerir de tratamiento fisioteraputico. COMPLICACIONES RELACIONADAS  La recurrencia frecuente de los sntomas puede dar como resultado un problema crnico. Un tratamiento adecuado en su inicio disminuye la probabilidad de recurrencias.  La inflamacin crnica, degeneracin cicatrizal del tendn y ruptura parcial del tendn, requieren [place].  Demora de la curacin o de la resolucin de los sntomas. TRATAMIENTO El tratamiento inicial consiste en la toma de medicamentos y la aplicacin de hielo para Best boy y reducir la hinchazn. Los ejercicios de elongacin y fortalecimiento pueden ayudar a reducir las molestias si se realizan con regularidad. Podr realizar estos ejercicios en su casa, si se trata de una afeccin aguda. Los casos crnicos pueden requerir la derivacin a un fisioterapeuta para Film/video editor evaluacin y Manufacturing systems engineer. Su mdico podr indicarle inyecciones con corticoides para reducir la inflamacin. Raras veces es necesario someterse a [place]. MEDICAMENTOS   Si es necesaria la administracin de  medicamentos para Conservation officer, historic buildings, se recomiendan los antiinflamatorios no esteroides, (aspirina e ibuprofeno) u otros calmantes menores (acetaminofeno).  No tome medicamentos para el dolor dentro de los 7 das previos a la [place].  El profesional podr prescribirle calmantes si lo considera necesario. Utilcelos como se le indique y slo cuando lo necesite.  Se podrn recomendar inyecciones de corticoesteorides. Estas inyecciones deben reservarse para los casos graves, porque slo se pueden administrar una determinada cantidad de veces. CALOR Y FRO  El fro debe aplicarse durante 10 a 15 minutos cada 2  3 horas para reducir la inflamacin y Conservation officer, historic buildings e inmediatamente despus de cualquier actividad que agrava los sntomas. Utilice bolsas o un masaje de hielo.  El calor puede usarse antes de Neurosurgeon y [place] fortalecimiento indicadas por el profesional, le fisioterapeuta o Industrial/product designer. Utilice una bolsa trmica o un pao hmedo. SOLICITE ATENCIN MDICA SI: Los sntomas empeoran o no mejoran en 2 semanas, a pesar de Chiropodist. EJERCICIOS EJERCICIOS DE AMPLITUD DE MOVIMIENTOS Y ELONGACIN - Epicondilitis lateral (codo de [place]) Estos ejercicios le ayudarn en la recuperacin de la lesin. Los sntomas podrn desaparecer con o sin mayor intervencin del profesional, el fisioterapeuta o Industrial/product designer. Al completar estos ejercicios, recuerde:   Restaurar la flexibilidad del tejido ayuda a que las articulaciones recuperen el movimiento normal. Esto permite que el movimiento y la actividad sea ms saludables y menos dolorosos.  Para que sea efectiva, cada elongacin debe realizarse durante al menos 30 segundos.  La elongacin nunca debe ser dolorosa. Deber sentir slo un alargamiento Bea Laura o  elongacin del tejido que estira. Lester de la [place], asistida  Extienda el codo derecha / izquierdo con los dedos apuntando [place] abajo.*  Tire  suavemente la palma de la mano hacia usted, hasta que sienta un ligero estiramiento de la parte superior del [place].  Mantenga esta posicin durante __________ segundos. Reptalo __________ veces. Realice este estiramiento __________ Vicenta Aly por da.  *Realice este ejercicio con el codo flexionado en lugar de extendido si el mdico, fisioterapeuta o entrenador se lo indican. [place] de la [place], asistida  Extienda el codo derecha / izquierdo con la palma Laurie arriba.*  Tire suavemente la palma y la punta de los dedos Salome atrs, para que la [place] se extienda y los dedos apunten hacia el suelo.  Debe sentir un ligero estiramiento en la parte interior del antebrazo.  Mantenga esta posicin durante __________segundos. Reptalo __________ veces. Realice este estiramiento __________ Vicenta Aly por da. *Realice este ejercicio con el codo flexionado en lugar de extendido si el mdico, fisioterapeuta o entrenador se lo indican. FUERZA - Flexin de la [employer] la palma de la mano derecha / izquierdo plana sobre una mesa, con el codo ligeramente doblado. Los dedos deben apuntar hacia el lado contrario del cuerpo.  Presione suavamente la parte de atrs de la mano en la mesa y enderece el codo. Debe sentir un ligero estiramiento en la parte superior del antebrazo.  Mantenga esta posicin durante __________ segundos. Reptalo __________ veces. Realice este estiramiento __________ Vicenta Aly por da.  [place] de la [employer] Energy puntas de los dedos de la mano derecha / izquierdo plana sobre una mesa, con el codo ligeramente doblado. Los dedos deben apuntar [employer]ere & Company.  Presione suavamente los dedos y la mano en la mesa y enderece el codo. Debe sentir un ligero estiramiento en la parte interna del antebrazo.  Mantenga esta posicin durante __________ segundos. Reptalo __________ veces. Realice este estiramiento __________ Vicenta Aly por da.  EJERCICIOS DE  FORTALECIMIENTO - Epicondilitis lateral (codo de [place]) Estos ejercicios le ayudarn en la recuperacin de la lesin. Los sntomas podrn desaparecer con o sin mayor intervencin del profesional, el fisioterapeuta o Industrial/product designer. Al completar estos ejercicios, recuerde:   Los msculos pueden ganar tanto la resistencia como la fuerza que necesita para sus actividades diarias a travs de ejercicios controlados.  Realice los ejercicios como se lo indic el mdico, el fisioterapeuta o Industrial/product designer. Aumente la resistencia y repeticiones segn se le haya indicado.  Podr experimentar dolor o cansancio muscular, pero el dolor o molestia que trata de eliminar a travs de los ejercicios nunca debe empeorar. Si el dolor empeora, detngase y asegrese de que est siguiendo las directivas correctamente. Si an siente dolor luego de Optometrist lo ajustes necesarios, deber discontinuar el ejercicio hasta que pueda conversar con el profesional sobre el problema. FUERZA - Flexores de la mueca  Sintese con el antebrazo derecha / izquierdo con la palma hacia arriba y completamente apoyado sobre una mesa o [place]. El codo Neurosurgeon en reposo y a la altura de los hombros. Haga que la [place] se extienda sobre los extremos de la superficie.  Sostenga sin apretar un peso de __________, Merla Riches goma o tubo para ejercicios en ambas manos, y doble lentamente la mano hacia el [place].  Mantenga esta posicin durante __________ segundos. Baje lentamente la [employer] Media posicin inicial, de forma controlada. Reptalo __________ veces. Realice este estiramiento __________ Vicenta Aly por da.  FUERZA - Extensores de la mueca  Sintese con el antebrazo derecha / izquierdo con la palma hacia abajo y completamente apoyado sobre una mesa o [place]. El codo Neurosurgeon en reposo y a la altura de los hombros. Haga que la [place] se extienda sobre los extremos de la superficie.  Sostenga sin apretar un peso de __________, Merla Riches  goma o tubo para ejercicios en ambas manos, y doble lentamente la mano hacia el [place].  Mantenga esta posicin durante __________ segundos. Baje lentamente la [employer] posicin inicial, de forma controlada. Reptalo __________ veces. Realice este estiramiento __________ Vicenta Aly por da.  FUERZA - Desviacin ulnar  Prese sosteniendo un peso de ____________________ en su mano derecha / izquierdo, o sintese sosteniendo una banda de goma para ejercicios, con el brazo sano apoyado en una mesa o mesada.  Mueva la [place], para que el dedo meique apunte hacia el antebrazo y Counselling psychologist en contra del mismo.  Mantenga esta posicin durante __________ segundos y luego lentamente baje la mueca a la posicin inicial. Reptalo __________ veces. Realice este ejercicio __________ veces por da. FUERZA - Desviacin radial  Prese sosteniendo un peso de ____________________ en su mano derecha / izquierdo, o sintese sosteniendo una banda de goma para ejercicios, con el brazo lesionado apoyado en una mesa o [place].  Eleve la mano hacia arriba, por delante suyo o tire [place] arriba la banda de [place].  Mantenga esta posicin durante __________ segundos y luego lentamente baje la mueca a la posicin inicial. Reptalo __________ veces. Realice este estiramiento __________ Vicenta Aly por da. FUERZA - Supinadores del antebrazo  Sintese con su antebrazo derecha / izquierdo apoyado sobre una mesa, manteniendo el codo por debajo de la altura del hombro. Apoye la [employer] borde, con la palma [place].  Suavemente tome un martillo o un cucharn de sopa.  Sin mover el codo, gire lentamente la palma y la mano hacia arriba para colocar el "pulgar arriba".  Mantenga esta posicin durante __________ segundos. Vuelva lentamente a la posicin inicial. Reptalo __________ veces. Realice este estiramiento __________ Vicenta Aly por da.  FUERZA - Pronadores del antebrazo  Sintese con su antebrazo derecha /  izquierdo apoyado sobre una mesa, manteniendo el codo por debajo de la altura del hombro. Apoye la [employer] borde, con la palma [place].  Suavemente tome un martillo o un cucharn de sopa.  Sin mover el codo, gire lentamente la palma y la mano hacia arriba para colocar el "pulgar arriba".  Mantenga esta posicin durante __________ segundos. Vuelva lentamente a la posicin inicial. Reptalo __________ veces. Realice este estiramiento __________ Vicenta Aly por da.  [place] una pelota de tenis, una esponja dura o una media larga y [place].  Apritela lo ms fuerte que pueda, sin Corporate treasurer.  Mantenga esta posicin durante __________ segundos. Sultela lentamente. Reptalo __________ veces. Realice este estiramiento __________ Vicenta Aly por da.  FUERZA - Extensores del codo, isomtrica  Prese o sintese erguido sobre una superficie firme. Coloque su brazo Recruitment consultant / izquierdo de modo que la palma de su mano quede frente al [place] y a la altura de su cintura.  Coloque la mano opuesta sobre lado inferior del [place]. Empuje suavemente hacia arriba mientras su brazo derecha / izquierdo opone resistencia. Empuje tan intensamente como pueda con ambos brazos sin causar Counselling psychologist ni realizar movimientos con su codo derecha / izquierdo. Mantenga esta posicin durante __________ segundos. Libere la tensin de ambos brazos gradualmente. Permita  que sus msculos se relajen completamente antes de repetir. Document Released: 04/05/2006 Document Revised: 09/11/2011 [facility] Patient Information 2015 [place]. This information is not intended to replace advice given to you by your health care provider. Make sure you discuss any questions you have with your health care provider.  Opciones de alimentos para pacientes con reflujo gastroesofgico (Food Choices for Gastroesophageal Reflux Disease) Cuando se tiene reflujo gastroesofgico (ERGE), los alimentos que se ingieren  y los hbitos de alimentacin son muy importantes. Elegir los alimentos adecuados puede ayudar a Public house manager las molestias ocasionadas por el [place]. [place]?  Elija las frutas, los vegetales, los cereales integrales, los productos lcteos, la carne de [place], de pescado y de ave con bajo contenido de grasas.  Limite las grasas, como los [place]on, los aderezos para [place], la [employer], los frutos secos y Publishing copy.  Lleve un registro de las comidas para identificar los alimentos que ocasionan sntomas.  Evite los alimentos que le ocasionen reflujo. Pueden ser distintos para cada persona.  Haga comidas pequeas con frecuencia en lugar de tres comidas Kellogg.  Coma lentamente, en un clima distendido.  Limite el consumo de alimentos fritos.  Cocine los alimentos utilizando mtodos que no sean la fritura.  Evite el consumo alcohol.  Evite beber grandes cantidades de lquidos con las comidas.  Evite agacharse o recostarse hasta despus de 2 o 3horas de haber comido. QU ALIMENTOS NO SE RECOMIENDAN? Los siguientes son algunos alimentos y bebidas que pueden empeorar los sntomas: Astronomer. Jugo de tomate. Salsa de tomate y espagueti. Ajes. Cebolla y [place]. Rbano picante. Frutas Naranjas, pomelos y limn (fruta y [place]). Carnes Carnes de [place], de pescado y de ave con gran contenido de grasas. Esto incluye los perros calientes, las [place], el [place], la salchicha, el salame y el tocino. Lcteos Leche entera y [place]. Rite Aid. Crema. Patrick. Helados. Queso crema.  Bebidas Caf y t negro, con o sin cafena Bebidas gaseosas o energizantes. Condimentos Salsa picante. Salsa barbacoa.  Dulces/postres Chocolate y cacao. [place]. Menta y mentol. Grasas y Unisys Corporation con alto contenido de grasas, incluidas las papas fritas. Otros Vinagre. Especias picantes, como la Solectron Corporation, la pimienta blanca, la pimienta  roja, la pimienta de cayena, el curry en [place], los clavos de [place], el jengibre y el [place] en polvo. Los artculos mencionados arriba pueden no ser Dean Foods Company de las bebidas y los alimentos que se Higher education careers adviser. Comunquese con el nutricionista para recibir ms informacin. Document Released: 03/29/2005 Document Revised: 06/24/2013 [facility] Patient Information 2015 [place]. This information is not intended to replace advice given to you by your health care provider. Make sure you discuss any questions you have with your health care provider.  Dispareunia  (Dyspareunia) La dispareunia es el dolor [place]. Es ms comn en las mujeres, pero tambin ocurre Humana Inc.  [place] dolor se siente cuando algo penetra en la vagina, pero cualquier parte de los genitales pueden causar dolor durante las relaciones sexuales. Puede sentir dolor incluso al sentarse o usar pantalones. En algunos casos no se conoce la causa. Algunas causas:   Infecciones de la piel que rodea la vagina.  Las infecciones vaginales, tales como hongos, bacterias o infeccin viral.  Vaginismo. Es la imposibilidad de Best boy algo en la vagina, incluso queriendo [place]. Hay una contraccin muscular automtica y Social research officer, government. El dolor de la contraccin muscular puede ser tan intenso que el coito  es imposible.  Reaccin alrgica a los espermicidas, semen, preservativos, tampones perfumados, jabones, duchas y Pitney Bowes.  Un saco lleno de lquido (quiste) en las glndulas de [place] o de [place], que se encuentra en la apertura de la vagina.  Tejido cicatrizal en la vagina por un agrandamiento quirrgico de la abertura (episiotoma) o desgarro despus de Best boy un beb.  Sequedad vaginal. Esto es ms frecuente en la menopausia. Las secreciones normales de la vagina estn disminuidas. Los Avnet niveles de estrgeno y la mayor dificultad para excitarse pueden causar dolor  durante el sexo. La sequedad vaginal tambin puede ocurrir cuando se toman pldoras anticonceptivas.  Adelgazamiento del tejido (atrofia) de la vulva y la vagina. Esto hace que la zona sea ms delgada, ms pequea, incapaz de estirarse para acomodar el pene, y propensa a infecciones y Tree surgeon.  Vestibulitis vulvar o vestibulodinia. Esta es una enfermedad que causa dolor y afecta el rea alrededor de la entrada de la vagina. La causa ms comn en las mujeres jvenes son las pldoras anticonceptivas. Las mujeres con niveles bajos de estrgenos (mujeres posmenopusicas) tambin pueden experimentarlo.Otras causas incluyen reacciones alrgicas, gran cantidad de terminaciones nerviosas, afecciones de la piel y msculos plvicos que no pueden relajarse.  Dermatosis vulvar. Esto incluye enfermedades de la piel como el liquen escleroso y liquen plano.  Falta de juegos previos para la lubricacin de la vagina. Esto puede causar sequedad vaginal.  Los tumores no cancerosos (fibromas) en el tero.  El tejido que reviste internamente al tero se desarrolla fuera del mismo (endometriosis).  Embarazo que comienza en la trompa de [place]opio (embarazo tubrico).  El embarazo o estar amamantando al beb. Esto puede causar sequedad vaginal.  Inclinacin o prolapso del tero. El prolapso se produce cuando los msculos dbiles y estirados alrededor del tero dejan que caiga en la vagina.  Problemas en los ovarios, quistes o cicatrices. Esto puede ser peor con ciertas posiciones sexuales.  Cirugas previas causando adherencias o tejido cicatrizal en la vagina o la pelvis.  Problemas en la vejiga e intestinales.  Problemas psicolgicos (como depresin o ansiedad). Esto puede hacer que el dolor empeore.  Actitudes negativas con respecto al sexo, haber sufrido una violacin, agresin sexual, y Technical brewer. Estos problemas suelen estar relacionados con algunos tipos de dolor.  Infeccin  plvica previa, causando un tejido cicatrizal en la pelvis y en los rganos femeninos.  Quiste o tumor en el ovario.  Cncer en los rganos femeninos.  Ciertos medicamentos.  Enfermedades como diabetes, artritis, o enfermedad de la tiroides. Masculino En los hombres, hay muchas causas fsicas del malestar sexual. Algunas causas:   Infecciones de la prstata, la vejiga, o las vesculas seminales. Pueden causar dolor despus de Insurance risk surveyor.  Inflamacin de la vejiga (cistitis intersticial). Puede causar dolor durante la eyaculacin.  Infecciones por gonorrea. Puede causar dolor durante la eyaculacin.  Inflamacin de la uretra (uretritis) o inflamacin de la prstata (prostatitis) . Puede hacer que la estimulacin genital sea dolorosa o incmoda.  Deformidades del pene como la enfermedad de [place].  Un prepucio estrecho.  Cncer en los rganos reproductivos masculinos.  Problemas psiclogicos. Esto puede hacer que el dolor empeore. DIAGNSTICO   El mdico har la historia clnica y tendr que describir el lugar donde se Midwife (fuera de la vagina, en la vagina, en la pelvis). Le preguntar en qu momento experimenta el dolor, durante la penetracin o con los choques.  Luego el Viacom har un examen fsico.  Hgale saber si sufre dolor durante el examen.  Durante la parte final del examen femenino, su mdico le palpar el tero y los ovarios con la mano sobre el abdomen y un dedo en la vagina. Es un examen plvico.  Le pedir anlisis de sangre, un Papanicolaou, cultivos en busca de infecciones, una ecografa y radiografas. Es posible que necesite ver a un especialista por problemas femeninos (gineclogo).  Su mdico indicar que se haga una tomografa computada, una resonancia magntica o una laparoscopia. La laparoscopia es un procedimiento para examinar la pelvis con un tubo con luz, a travs de un corte (incisin) en el abdomen. TRATAMIENTO  Su mdico  determinar el mejor curso de [place]. En algunos casos se solicitan ms pruebas. Contine con los estudios indicados hasta que el mdico est seguro acerca del diagnstico y [place]. A veces, es difcil de Animator causa del dolor. La bsqueda de la causa y el tratamiento pueden ser frustrantes. El tratamiento generalmente demora varias semanas o meses antes de notar [place]. Tambin puede ser necesario evitar la actividad sexual hasta que los sntomas mejoren. Si sigue teniendo Armed forces operational officer cuando tiene dolor puede Lexicographer la curacin y Patent examiner.  El tratamiento depende de la causa del dolor. Puede incluir:   Medicamentos como antibiticos, cremas vaginales, para la piel, hormonas, o antidepresivos.  Ciruga mayor o menor.  El asesoramiento psicolgico o terapia de [place].  Ejercicios de Kegel y dilatadores vaginales para ayudar en ciertos casos de vaginismo (espasmos). Hgalos slo si se lo recomienda su mdico. Los ejercicios Kegel pueden hacer que algunos problemas empeoren.  La aplicacin de un lubricante segn las indicaciones del mdico si tiene sequedad.  Terapia sexual para usted y su compaero. Es comn que el dolor contine despus de tratar la causa. Puede ser por Ardelia Mems respuesta condicionada. Esto significa la persona se vuelve tan familiar con el dolor que persiste como [place], aunque se elimine la causa. La terapia sexual puede ayudar con este problema.  INSTRUCCIONES PARA EL CUIDADO EN EL HOGAR   Siga las instrucciones de su mdico acerca de como Mattel, las [place], terapias y visitas de control.  No use tampones, duchas vaginales , aerosoles vaginales o jabones perfumados.  Use lubricantes a base de agua para la sequedad. Los lubricantes con aceites pueden causar irritacin.  No utilice espermicidas o condones que Lobbyist.  Hable abiertamente con su pareja sobre su experiencia sexual , sus deseos, el juego  previo y las diferentes posiciones sexuales para Ardelia Mems relacin sexual ms cmoda y Materials engineer.  nase a sesiones de [place] de [place], si es necesario.  Practique sexo seguro en todo momento.  Vace su vejiga antes de Clinical biochemist.  Pruebe diferentes posiciones durante el coito.  Tome medicamentos de venta libre para Conservation officer, historic buildings segn las indicaciones del mdico, antes de Clinical biochemist.  No use panties. Use las medias que llegan a la altura de la rodilla o del muslo.  Evite frotar la vulva con un pao. Lave suavemente la zona y squela con una toalla dando golpecitos. SOLICITE ATENCIN MDICA SI:   Tiene hemorragias [place].  Observa un bulto en la abertura de la vagina, aunque no sea doloroso.  Tiene flujo vaginal anormal.  Tiene sequedad vaginal.  Siente tiene picazn o irritacin de la vulva o la vagina.  Aparece una erupcin o tiene una reaccin a los medicamentos. SOLICITE ATENCIN MDICA DE INMEDIATO SI:   Siente dolor abdominal intenso  durante o poco despus de las [employer]ot. Usted podra haber sufrido la ruptura de un quiste ovrico o un embarazo ectpico.  Tiene fiebre.  Comienza a Education officer, environmental al orinar u Gap Inc.  Tiene relaciones sexuales dolorosas, y Psychiatrist ocurri antes.  Se desmaya despus de Clinical biochemist. Document Released: 03/01/2011 Document Revised: 09/11/2011 [facility] Patient Information 2015 [employer]. This information is not intended to replace advice given to you by your health care provider. Make sure you discuss any questions you have with your health care provider.

## 2014-08-01 NOTE — Progress Notes (Addendum)
Subjective:  This chart was scribed for Julie Sorenson, MD, by Elon Spanner, ED Scribe. This patient was seen in room Rm 3 and the patient's care was started at 10:19 AM.   Patient ID: Julie Herman, female    DOB: 02/17/1976, 39 y.o.   MRN: [id number] Chief Complaint  Patient presents with  . Annual Exam    Fasting--requesting Pap     HPI  HPI Comments: Julie Herman is a 39 y.o. female with no significant pmh.  Reports a history of hyperliipidemia  No recent fast or lipid panel.  Although she was previously on statin cholesterol medication and had seen a cardiologist in a different network .  3 months ago she was seen by Dr. Merla Riches in our office for right shoulder pain; suspected rotator cough tendonitis treated with antiinflammatories and home exercises.   Patient speaks only some english and is accompanied by husband who is acting as a Nurse, learning disability.   She presents to [facility] requesting a full physical exam.  Patient reports she stopped taking medication for HLD 1 year ago but cannot recall the name.    She also notes some constant bilateral shoulder, elbow, wrist, hand pain as well as intermittent bilateral knee, ankle, low back pain.  The pain is worse at night.  She reports associated numbness/tingling in her bilateral hands and some myalgias on her bilateral inner elbows.  She reports her occupation requires her to make frequent, repetitive motions with her arms.  Patient denies warmth from area of complaints.  Patient has taken a 1 month course of prednisone and mobic prescribed to her 3 months ago at [facility].  She also has taken cyclobenzaprene prescribed at her last visit for use at night.  This medication afforded relief but she is currently out.  She currently takes Lyrica prescribed to her sister by [place] Dr[place] but denies relief.  She also takes 3-4 tablets of ibuprofen once daily.  The medication afforded some minor, transient relief.  Patient reports a family history of arthritis  in her sister.  Patient reports a history of abdominal/GU cancer in her mother's sister.  Patient denies history of cancer in mother or sisters.  Patient reports she often wakes without feeling rested and frequently only gets 3- 4 hours of sleep nightly due to pain.   Patient denies hip pain, foot tingling/numbness.  Patient reports a single abnormal pap smear several years ago, but all subsequent pap smears have been normal, most recently 3 months ago.  Patient denies surgical history.  Patient no longer takes birth control medication.  Her mentrual cycle is irregular since stopping birth control.  Last menstrual period 06/25/15.  Intermittent acid reflux with spicy food.    Patient takes a daily brain health supplement.  Patient denies taking calcium, vitamin D regularly.  Patient reports she eats some yogurt but denies eating milk, cheese regularly.   Patient's husband states the patient is interested in a vitamin or medication that could increase the patient's sex drive.    Patient has not eaten today.       No past medical history on file. Current Outpatient Prescriptions on File Prior to Visit  Medication Sig Dispense Refill  . cyclobenzaprine (FLEXERIL) 10 MG tablet Take 1 tablet (10 mg total) by mouth at bedtime. 30 tablet 0  . meloxicam (MOBIC) 15 MG tablet Take 1 tablet (15 mg total) by mouth daily. 30 tablet 0  . predniSONE (DELTASONE) 20 MG tablet 3/3/2/2/1/1 single daily dose  for 6 days for inflammation 12 tablet 0   No current facility-administered medications on file prior to visit.   No Known Allergies  No past surgical history on file. No family history on file. History   Social History  . Marital Status: Married    Spouse Name: N/A    Number of Children: N/A  . Years of Education: N/A   Social History Main Topics  . Smoking status: Never Smoker   . Smokeless tobacco: None  . Alcohol Use: Yes  . Drug Use: No  . Sexual Activity: None   Other Topics Concern  . None    Social History Narrative     Review of Systems  Musculoskeletal: Positive for myalgias and arthralgias.  Neurological: Positive for numbness.  All other systems reviewed and are negative.      Objective:  BP 106/68 mmHg  Pulse 64  Temp(Src) 98.3 F (36.8 C) (Oral)  Ht 5' (1.524 m)  Wt 149 lb (67.586 kg)  BMI 29.10 kg/m2  SpO2 98%  LMP 08/25/2013  Physical Exam  Constitutional: She is oriented to person, place, and time. She appears well-developed and well-nourished. No distress.  HENT:  Head: Normocephalic and atraumatic.  Pale, boggy nasal mucosa with clear rhinorrhea. TM's normal.  Oropharynx clear and moist.      Eyes: Conjunctivae and EOM are normal.  Neck: Neck supple. No tracheal deviation present.  Cardiovascular: Normal rate.   Pulmonary/Chest: Effort normal. No respiratory distress.  Breast exam: normal  Genitourinary:  Pelvic exam: Normal labia and vagina but cervix was mildly friable with wide and obvious transition zone on exterior cervix with several nabothian cysts.  No cervical motion tenderness.  Uterus is antiverted, small, mobile, but mildly TTP with no adenexal enlargement or tenderness.    Musculoskeletal: Normal range of motion.  Neurological: She is alert and oriented to person, place, and time.  Pain over left lateral epichondyle.  TTP worse with resisted wrist extension and radial rotaton.   Skin: Skin is warm and dry.  Psychiatric: She has a normal mood and affect. Her behavior is normal.  Nursing note and vitals reviewed.       Assessment & Plan:  10:36 AM Will order labs and perform pap smear.  Patient will be prescribed arthritis medication and medication for sleep.  Patient advised to follow-up in 4-6 weeks.   Pt speaks some English but husband largly translates for her today.  Well adult exam - Plan: Pap IG, CT/NG NAA, and HPV (high risk), POCT glycosylated hemoglobin (Hb A1C), POCT UA - Microscopic Only, POCT urinalysis dipstick,  POCT Wet Prep with KOH, Urine culture, Vitamin B12, C-reactive protein, Sedimentation rate, Vit D  25 hydroxy (rtn osteoporosis monitoring), Lipid panel, Comprehensive metabolic panel, CBC, TSH, Pap IG, CT/NG NAA, and HPV (high risk) - pt reports h/o HPL prev on med - reviewed low fat/cholesterol diet.  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  Myalgia and myositis - Plan: Pap IG, CT/NG  NAA, and HPV (high risk), POCT glycosylated hemoglobin (Hb A1C), POCT UA - Microscopic Only, POCT urinalysis dipstick, POCT Wet Prep with KOH, Urine culture, Vitamin B12, C-reactive protein, Sedimentation rate, Vit D  25 hydroxy (rtn osteoporosis monitoring), Lipid panel, Comprehensive metabolic panel, CBC, TSH, Pap IG, CT/NG NAA, and HPV (high risk)  Lateral epicondylitis (tennis elbow), left - start elbow band constantly w/ home exercises x 3-4 wks until sxs completely resolve - if sxs persist rec referral to sports med  Gastroesophageal reflux disease, esophagitis presence not specified - start ppi esp and starting nsaid  Dyspareunia - Plan: US Pelvis Complete - pt interested in medicine to improve libido - hopefully if we find a way to treat her pain her mood sxs will improve - if not could consider trial of otc herbal supplements vs wellbutrin or even addyi - we may want to consider gyn eval if sxs do not improve  UTI = + Klebsiella but only 20K CFU - start cipro 500 bid x 5d but also sensitive to  cephalosporins.  Meds ordered this encounter  Medications  . cyclobenzaprine (FLEXERIL) 10 MG tablet    Sig: Take 1 tablet (10 mg total) by mouth at bedtime.    Dispense:  30 tablet    Refill:  0  . naproxen (NAPROSYN) 500 MG tablet    Sig: Take 1 tablet (500 mg total) by mouth 2 (two) times daily with a meal.    Dispense:  60 tablet    Refill:  1    Please give pt information in spanish including Do not use with any other otc pain medication other than tylenol/acetaminophen - so no aleve, ibuprofen, motrin, advil, etc.  . omeprazole (PRILOSEC) 40 MG capsule    Sig: Take 1 capsule (40 mg total) by mouth daily.    Dispense:  30 capsule    Refill:  3    I personally performed the services described in this documentation, which was scribed in my presence. The recorded information has been reviewed and considered, and addended by me as needed.  Julie Sorenson, MD MPH  Results for orders placed or performed in visit on 08/01/14  Urine culture  Result Value Ref Range   Colony Count 20,OOO COLONIES/ML    Preliminary Report Gram Negative Rods   Vitamin B12  Result Value Ref Range   Vitamin B-12 493 211 - 911 pg/mL  C-reactive protein  Result Value Ref Range   CRP <0.5 <0.60 mg/dL  Sedimentation rate  Result Value Ref Range   Sed Rate 14 0 - 22 mm/hr  Vit D  25 hydroxy (rtn osteoporosis monitoring)  Result Value Ref Range   Vit D, 25-Hydroxy  30 - 100 ng/mL  Lipid panel  Result Value Ref Range   Cholesterol 197 0 - 200 mg/dL   Triglycerides 161 (H) <150 mg/dL   HDL 49 >09 mg/dL   Total CHOL/HDL Ratio 4.0 Ratio   VLDL 41 (H) 0 - 40 mg/dL   LDL Cholesterol 604 (H) 0 - 99 mg/dL  Comprehensive metabolic panel  Result Value Ref Range   Sodium 136 135 - 145 mEq/L   Potassium 4.4 3.5 - 5.3 mEq/L   Chloride 103 96 - 112 mEq/L   CO2 24 19 - 32 mEq/L   Glucose, Bld 78 70 - 99 mg/dL   BUN 9 6 - 23 mg/dL   Creat 5.40 9.81 - 1.91 mg/dL   Total Bilirubin 0.7 0.2 - 1.2 mg/dL   Alkaline  Phosphatase 86 39 - 117 U/L   AST 21 0 - 37 U/L   ALT 24 0 - 35 U/L   Total Protein 7.3 6.0 - 8.3 g/dL   Albumin 3.9 3.5 - 5.2 g/dL   Calcium 9.3 8.4 - 96.010.5 mg/dL  CBC  Result Value Ref Range   WBC 8.3 4.0 - 10.5 K/uL   RBC 4.82 3.87 - 5.11 MIL/uL   Hemoglobin 13.3 12.0 - 15.0 g/dL   HCT 45.439.5 09.836.0 - 11.946.0 %   MCV 82.0 78.0 - 100.0 fL   MCH 27.6 26.0 - 34.0 pg   MCHC 33.7 30.0 - 36.0 g/dL   RDW 14.714.2 82.911.5 - 56.215.5 %   Platelets 349 150 - 400 K/uL   MPV 10.5 8.6 - 12.4 fL  TSH  Result Value Ref Range   TSH 0.687 0.350 - 4.500 uIU/mL  POCT glycosylated hemoglobin (Hb A1C)  Result Value Ref Range   Hemoglobin A1C 5.5   POCT UA - Microscopic Only  Result Value Ref Range   WBC, Ur, HPF, POC 0-1    RBC, urine, microscopic 5-8    Bacteria, U Microscopic neg    Mucus, UA neg    Epithelial cells, urine per micros 0-2    Crystals, Ur, HPF, POC neg    Casts, Ur, LPF, POC neg    Yeast, UA neg   POCT urinalysis dipstick  Result Value Ref Range   Color, UA yellow    Clarity, UA clear    Glucose, UA neg    Bilirubin, UA neg    Ketones, UA neg    Spec Grav, UA 1.020    Blood, UA moderate    pH, UA 6.0    Protein, UA neg    Urobilinogen, UA 0.2    Nitrite, UA neg    Leukocytes, UA Negative   POCT Wet Prep with KOH  Result Value Ref Range   Trichomonas, UA Negative    Clue Cells Wet Prep HPF POC 0-2    Epithelial Wet Prep HPF POC 7-9    Yeast Wet Prep HPF POC Negative    Bacteria Wet Prep HPF POC 2+    RBC Wet Prep HPF POC 3-7    WBC Wet Prep HPF POC 0-4    KOH Prep POC Negative

## 2014-08-02 LAB — SEDIMENTATION RATE: Sed Rate: 14 mm/hr (ref 0–22)

## 2014-08-03 ENCOUNTER — Other Ambulatory Visit: Payer: Self-pay | Admitting: Family Medicine

## 2014-08-03 DIAGNOSIS — IMO0002 Reserved for concepts with insufficient information to code with codable children: Secondary | ICD-10-CM

## 2014-08-03 LAB — VITAMIN D 25 HYDROXY (VIT D DEFICIENCY, FRACTURES): Vit D, 25-Hydroxy: 32 ng/mL (ref 30–100)

## 2014-08-03 LAB — URINE CULTURE

## 2014-08-04 ENCOUNTER — Encounter: Payer: Self-pay | Admitting: Family Medicine

## 2014-08-04 MED ORDER — CIPROFLOXACIN HCL 500 MG PO TABS: 500.0000 mg | ORAL_TABLET | Freq: Two times a day (BID) | ORAL | Status: DC

## 2014-08-04 NOTE — Addendum Note (Signed)
Addended by: Norberto SorensonSHAW, Jiyaan Steinhauser on: 08/04/2014 01:05 AM   Modules accepted: Orders

## 2014-08-05 LAB — PAP IG, CT-NG NAA, HPV HIGH-RISK
Chlamydia Probe Amp: NEGATIVE
GC Probe Amp: NEGATIVE
HPV DNA High Risk: NOT DETECTED

## 2014-08-10 ENCOUNTER — Ambulatory Visit
Admission: RE | Admit: 2014-08-10 | Discharge: 2014-08-10 | Disposition: A | Discharge status: Home / Self Care | Payer: Managed Care, Other (non HMO) | Source: Ambulatory Visit | Attending: Family Medicine | Admitting: Family Medicine

## 2014-08-10 DIAGNOSIS — IMO0002 Reserved for concepts with insufficient information to code with codable children: Secondary | ICD-10-CM

## 2014-08-10 NOTE — Progress Notes (Signed)
Scheduled a follow up appointment with Dr. Patsy Lageropland on 09/14/14 at 8 am

## 2014-09-14 ENCOUNTER — Encounter: Payer: Self-pay | Admitting: Family Medicine

## 2014-09-14 ENCOUNTER — Ambulatory Visit (INDEPENDENT_AMBULATORY_CARE_PROVIDER_SITE_OTHER): Payer: Managed Care, Other (non HMO) | Admitting: Family Medicine

## 2014-09-14 ENCOUNTER — Ambulatory Visit (INDEPENDENT_AMBULATORY_CARE_PROVIDER_SITE_OTHER): Payer: Managed Care, Other (non HMO)

## 2014-09-14 VITALS — BP 108/75 | HR 76 | Temp 98.2°F | Resp 16 | Ht 61.0 in | Wt 153.0 lb

## 2014-09-14 DIAGNOSIS — M25522 Pain in left elbow: Secondary | ICD-10-CM

## 2014-09-14 DIAGNOSIS — Z789 Other specified health status: Secondary | ICD-10-CM | POA: Diagnosis not present

## 2014-09-14 NOTE — Patient Instructions (Signed)
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 El dolor tiene su origen en la inflamacin de los tendones del antebrazo que extienden la [place]. La epicondilitis lateral, tambin es denominada codo de [place] debido a que es muy frecuente [employer] jugadores de tenis. Sin embargo, Nurse, children's individuo que extienda la mueca de [place] repetitiva. Si esta afeccin no se trata, puede transformarse en un problema crnico. SNTOMAS  Dolor y sensibilidad e inflamacin en la zona externa (lateral) del codo.  Dolor o debilidad al tomar [employer].  Dolor que [employer] con los movimientos de rotacin de la mueca (jugar al tenis, usar un Engineer, maintenance, abrir una puerta o un frasco).  Dolor al levantar objetos, inclusive [place] taza de caf. CAUSAS  La causa de la epicondilitis lateral es la inflamacin de los tendones que extienden la [place]. Entre las causas se incluyen:  Librarian, academic repetitivo y distensin de los msculos y los tendones que extienden la [place].  Cambio repentino en el nivel o intensidad de la [place].  Agarre incorrecto en los deportes con raqueta.  Tamao incorrecto del puo de la raqueta (con frecuencia demasiado grande).  Posicin o tcnica incorrecta al [employer] un golpe (generalmente con el dorso de la mano; llevada por el codo).  Utilizar una raqueta demasiado pesada. LOS RIESGOS AUMENTAN CON:  Los deportes u ocupaciones que requieren movimientos repetitivos y extenuantes del antebrazo y la [place]  (tenis, squash, deportes con raqueta, carpintera).  Poca fuerza y flexibilidad de la [place] y [place].  Precalentamiento y elongacin inadecuados antes de la [place].  Regreso a la actividad antes de [employer] curacin, la rehabilitacin y Public relations account executive. PREVENCIN  Precalentamiento adecuado y elongacin antes de la [place].  Mantener la forma fsica:  [place], flexibilidad y resistencia muscular.  Capacidad cardiovascular.  Utilice un equipo que le ajuste adecuadamente.  Usar la tcnica correcta y Warehouse manager un entrenador que corrija la tcnica incorrecta.  Use un vendaje para el codo apropiado para el tenis (contrafuerza). PRONSTICO El curso de la enfermedad depende del grado de la lesin. Si se trata adecuadamente, los casos agudos (sntomas que duran menos de 4 semanas) generalmente se resuelven en un perodo de 2 a 6 semanas. Los casos crnicos (que duran ms tiempo) se resuelven en un lapso de 3 a 6 meses, pero pueden requerir de tratamiento fisioteraputico. COMPLICACIONES RELACIONADAS  La recurrencia frecuente de los sntomas puede dar como resultado un problema crnico. Un tratamiento adecuado en su inicio disminuye la probabilidad de recurrencias.  La inflamacin crnica, degeneracin cicatrizal del tendn y ruptura parcial del tendn, requieren [place].  Demora de la curacin o de la resolucin de los sntomas. TRATAMIENTO El tratamiento inicial consiste en la toma de medicamentos y la aplicacin de hielo para Engineer, materials y reducir la hinchazn. Los ejercicios de elongacin y fortalecimiento pueden ayudar a reducir las molestias si se realizan con regularidad. Podr realizar estos ejercicios en su casa, si se trata de una afeccin aguda. Los casos crnicos pueden requerir la derivacin a  un fisioterapeuta para realizar una evaluacin y Dentist. Su mdico podr indicarle inyecciones con corticoides para reducir la inflamacin. Raras veces es  necesario someterse a [place]. MEDICAMENTOS   Si es necesaria la administracin de medicamentos para Chief Technology Officer, se recomiendan los antiinflamatorios no esteroides, (aspirina e ibuprofeno) u otros calmantes menores (acetaminofeno).  No tome medicamentos para el dolor dentro de los [address] previos a la [place].  El profesional podr prescribirle calmantes si lo considera necesario. Utilcelos como se le indique y slo cuando lo necesite.  Se podrn recomendar inyecciones de corticoesteorides. Estas inyecciones deben reservarse para los [place] graves, porque slo se pueden administrar una determinada cantidad de veces. CALOR Y FRO  El fro debe aplicarse durante 10 a 15 minutos cada 2  3 horas para reducir la inflamacin y Chief Technology Officer e inmediatamente despus de cualquier actividad que agrava los sntomas. Utilice bolsas o un masaje de hielo.  El calor puede usarse antes de Therapist, music y de las actividades de fortalecimiento indicadas por el profesional, le fisioterapeuta o Orthoptist. Utilice una bolsa trmica o un pao hmedo. SOLICITE ATENCIN MDICA SI: Los sntomas empeoran o no mejoran en 2 semanas, a pesar de Medical illustrator. EJERCICIOS EJERCICIOS DE AMPLITUD DE MOVIMIENTOS Y ELONGACIN - Epicondilitis lateral (codo de [place]) Estos ejercicios le ayudarn en la recuperacin de la lesin. Los sntomas podrn desaparecer con o sin mayor intervencin del profesional, el fisioterapeuta o Orthoptist. Al completar estos ejercicios, recuerde:   Restaurar la flexibilidad del tejido ayuda a que las articulaciones recuperen el movimiento normal. Esto permite que el movimiento y la actividad sea ms saludables y menos dolorosos.  Para que sea efectiva, cada elongacin debe realizarse durante al menos 30 segundos.  La elongacin nunca debe ser dolorosa. Deber sentir slo un alargamiento suave o elongacin del tejido que estira. AMPLITUD DE MOVIMIENTOS - Flexin de la [place],  asistida  Extienda el codo derecha / izquierdo con los dedos apuntando [place] abajo.*  Tire suavemente la palma de la mano hacia usted, hasta que sienta un ligero estiramiento de la parte superior del [place].  Mantenga esta posicin durante __________ segundos. Reptalo __________ veces. Realice este estiramiento __________ Anthoney Harada por da.  *Realice este ejercicio con el codo flexionado en lugar de extendido si el mdico, fisioterapeuta o entrenador se lo indican. AMPLITUD DE MOVIMIENTOS - Flexin de la [place], asistida  Extienda el codo derecha / izquierdo con la palma [place] arriba.*  Tire suavemente la palma y la punta de los dedos McIntosh atrs, para que la [place] se extienda y los dedos apunten hacia el suelo.  Debe sentir un ligero estiramiento en la parte interior del antebrazo.  Mantenga esta posicin durante __________segundos. Reptalo __________ veces. Realice este estiramiento __________ Anthoney Harada por da. *Realice este ejercicio con el codo flexionado en lugar de extendido si el mdico, fisioterapeuta o entrenador se lo indican. FUERZA - Flexin de la [employer] la palma de la mano derecha / izquierdo plana sobre una mesa, con el codo ligeramente doblado. Los dedos deben apuntar hacia el lado contrario del cuerpo.  Presione suavamente la parte de atrs de la mano en la mesa y enderece el codo. Debe sentir un ligero estiramiento en la parte superior del antebrazo.  Mantenga esta posicin durante __________ segundos. Reptalo __________ veces. Realice este estiramiento __________ Anthoney Harada por da.  FUERZA - Extensin de la [employer] las puntas de los dedos de la mano derecha / izquierdo plana sobre [place],  con el codo ligeramente doblado. Los dedos deben apuntar [place] atrs.  Presione suavamente los dedos y la mano en la mesa y enderece el codo. Debe sentir un ligero estiramiento en la parte interna del antebrazo.  Mantenga esta posicin durante __________  segundos. Reptalo __________ veces. Realice este estiramiento __________ Anthoney Haradaveces por da.  EJERCICIOS DE FORTALECIMIENTO - Epicondilitis lateral (codo de Bulgariatenista) Estos ejercicios le ayudarn en la recuperacin de la lesin. Los sntomas podrn desaparecer con o sin mayor intervencin del profesional, el fisioterapeuta o Orthoptistel entrenador. Al completar estos ejercicios, recuerde:   Los msculos pueden ganar tanto la resistencia como la fuerza que necesita para sus actividades diarias a travs de ejercicios controlados.  Realice los ejercicios como se lo indic el mdico, el fisioterapeuta o Orthoptistel entrenador. Aumente la resistencia y repeticiones segn se le haya indicado.  Podr experimentar dolor o cansancio muscular, pero el dolor o molestia que trata de eliminar a travs de los ejercicios nunca debe empeorar. Si el dolor empeora, detngase y asegrese de que est siguiendo las directivas correctamente. Si an siente dolor luego de Education officer, environmentalrealizar lo ajustes necesarios, deber discontinuar el ejercicio hasta que pueda conversar con el profesional sobre el problema. FUERZA - Flexores de la mueca  Sintese con el antebrazo derecha / izquierdo con la palma hacia arriba y completamente apoyado sobre una mesa o [place]es[place]. El codo Scientist, research (physical sciences)debe estar en reposo y a la altura de los hombros. Haga que la [place] se extienda sobre los extremos de la superficie.  Sostenga sin apretar un peso de __________, Cheron Schaumanno una goma o tubo para ejercicios en ambas manos, y doble lentamente la mano hacia el Elmoreantebrazo.  Mantenga esta posicin durante __________ segundos. Baje lentamente la Verizonmueca hasta la posicin inicial, de forma controlada. Reptalo __________ veces. Realice este estiramiento __________ Anthoney Haradaveces por da.  FUERZA - Extensores de la mueca  Sintese con el antebrazo derecha / izquierdo con la palma hacia abajo y completamente apoyado sobre una mesa o [place]mesada. El codo Scientist, research (physical sciences)debe estar en reposo y a la altura de los hombros. Haga que la [place]  se extienda sobre los extremos de la superficie.  Sostenga sin apretar un peso de __________, Cheron Schaumanno una goma o tubo para ejercicios en ambas manos, y doble lentamente la mano hacia el [place]ideantebrazo.  Mantenga esta posicin durante __________ segundos. Baje lentamente la Verizonmueca hasta la posicin inicial, de forma controlada. Reptalo __________ veces. Realice este estiramiento __________ Anthoney Haradaveces por da.  FUERZA - Desviacin ulnar  Prese sosteniendo un peso de ____________________ en su mano derecha / izquierdo, o sintese sosteniendo una banda de goma para ejercicios, con el brazo sano apoyado en una mesa o mesada.  Mueva la [place], para que el dedo meique apunte hacia el antebrazo y Multimedia programmerel pulgar en contra del mismo.  Mantenga esta posicin durante __________ segundos y luego lentamente baje la mueca a la posicin inicial. Reptalo __________ veces. Realice este ejercicio __________ veces por da. FUERZA - Desviacin radial  Prese sosteniendo un peso de ____________________ en su mano derecha / izquierdo, o sintese sosteniendo una banda de goma para ejercicios, con el brazo lesionado apoyado en una mesa o [place]ada.  Eleve la mano hacia arriba, por delante suyo o tire Portugalhacia arriba la banda de [place]goma.  Mantenga esta posicin durante __________ segundos y luego lentamente baje la mueca a la posicin inicial. Reptalo __________ veces. Realice este estiramiento __________ Anthoney Haradaveces por da. FUERZA - Supinadores del antebrazo  Sintese con su antebrazo derecha / izquierdo apoyado The PNC Financialsobre una  mesa, manteniendo el codo por debajo de la altura del hombro. Apoye la [employer] borde, con la palma [place].  Suavemente tome un martillo o un cucharn de sopa.  Sin mover el codo, gire lentamente la palma y la mano hacia arriba para colocar el "pulgar arriba".  Mantenga esta posicin durante __________ segundos. Vuelva lentamente a la posicin inicial. Reptalo __________ veces. Realice este estiramiento  __________ Anthoney Harada por da.  FUERZA - Pronadores del antebrazo  Sintese con su antebrazo derecha / izquierdo apoyado sobre una mesa, manteniendo el codo por debajo de la altura del hombro. Apoye la [employer] borde, con la palma [place].  Suavemente tome un martillo o un cucharn de sopa.  Sin mover el codo, gire lentamente la palma y la mano hacia arriba para colocar el "pulgar arriba".  Mantenga esta posicin durante __________ segundos. Vuelva lentamente a la posicin inicial. Reptalo __________ veces. Realice este estiramiento __________ Anthoney Harada por da.  FUERZA - [place]  [employer] pelota de tenis, una esponja dura o una media larga y [place].  Apritela lo ms fuerte que pueda, sin Tax inspector.  Mantenga esta posicin durante __________ segundos. Sultela lentamente. Reptalo __________ veces. Realice este estiramiento __________ Anthoney Harada por da.  FUERZA - Extensores del codo, isomtrica  Prese o sintese erguido sobre una superficie firme. Coloque su brazo Sales executive / izquierdo de modo que la palma de su mano quede frente al [place] y a la altura de su cintura.  Coloque la mano opuesta sobre lado inferior del [place]. Empuje suavemente hacia arriba mientras su brazo derecha / izquierdo opone resistencia. Empuje tan intensamente como pueda con ambos brazos sin causar Scientist, research (medical) ni realizar movimientos con su codo derecha / izquierdo. Mantenga esta posicin durante __________ segundos. Libere la tensin de ambos brazos gradualmente. Permita que sus msculos se relajen completamente antes de repetir. Document Released: 04/05/2006 Document Revised: 09/11/2011 [employer] [facility] [employer] Patient Information 2015 [place], [place]. This information is not intended to replace advice given to you by your health care provider. Make sure you discuss any questions you have with your health care provider.

## 2014-09-14 NOTE — Progress Notes (Signed)
[facility] [address] [place] [address] [phone]  Date:  09/14/2014   Name:  Julie Herman   DOB:  1975/10/20   MRN:  [id number]  PCP:  No PCP Per Patient    Chief Complaint: Follow-up and Joint Pain   History of Present Illness:  Julie Herman is a 39 y.o. very pleasant female patient who presents with the following:  She is here to check on her arm pain.  In January she was here for a CPE, and also for pain in her arm and joints.  She noted pain in her bilateral elbows and shoulders at that time. She is here today with persistent pain in her left elbow for about 2 months, medicaiton does not seem to be helping.  The right side is ok.  She is not taking her cholesterol medication at this time- she did not seem to be aware that she should be taking it.  She is right handed.  She is having a hard time using her left hand for work due to pain. 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  She has tried naproxen and an OTC tennis elbow brace, as well as flexeril at bedtime.    LMP was 09/02/14\ Here today with an interpreter    Patient Active Problem List   Diagnosis Date Noted  . Hyperlipidemia 04/10/2014    No past medical history on file.  No past surgical history on file.  History  Substance Use Topics  . Smoking status: Never Smoker   . Smokeless tobacco: Not on file  . Alcohol Use: Yes    No family history on file.  No Known Allergies  Medication list has been reviewed and updated.  Current Outpatient Prescriptions on File Prior to Visit  Medication Sig Dispense Refill  . cyclobenzaprine (FLEXERIL) 10 MG tablet Take 1 tablet (10 mg total) by mouth at bedtime. 30 tablet 0  . naproxen (NAPROSYN) 500 MG tablet Take 1 tablet (500 mg total) by mouth 2 (two) times daily with a meal. 60 tablet 1   No current facility-administered medications on file prior to visit.    Review of Systems:  As per HPI- otherwise  negative.   Physical Examination: Filed Vitals:   09/14/14 0836  BP: 108/75  Pulse: 76  Temp: 98.2 F (36.8 C)  Resp: 16   Filed Vitals:   09/14/14 0836  Height:  (1.549 m)  Weight: 153 lb (69.4 kg)   Body mass index is 28.92 kg/(m^2). Ideal Body Weight: Weight in (lb) to have BMI = 25: 132  GEN: WDWN, NAD, Non-toxic, A & O x 3, mild overweight, looks well HEENT: Atraumatic, Normocephalic. Neck supple. No masses, No LAD. Ears and Nose: No external deformity. CV: RRR, No M/G/R. No JVD. No thrill. No extra heart sounds. PULM: CTA B, no wheezes, crackles, rhonchi. No retractions. No resp. distress. No accessory muscle use. EXTR: No c/c/e NEURO Normal gait.  PSYCH: Normally interactive. Conversant. Not depressed or anxious appearing.  Calm demeanor.  Left elbow: she has mild tenderness over the lateral epicondyle.  Normal ROM to flexion, extension, supination and pronation.  Both shoulders wnl  [facility] reading (PRIMARY) by  Dr. Patsy Lager. Left elbow: negative   Assessment and Plan: Left elbow pain - Plan: DG Elbow Complete Left, Ambulatory referral to Orthopedic Surgery  Persistent left elbow pain consistent with lateral epicondylitis.  She is already using a tennis elbow strap  and has failed 2 months of conservative therapy.  Will refer to sports med for possible injection  Signed Abbe AmsterdamJessica Anthony Roland, MD

## 2014-11-16 ENCOUNTER — Ambulatory Visit (INDEPENDENT_AMBULATORY_CARE_PROVIDER_SITE_OTHER): Payer: Managed Care, Other (non HMO)

## 2014-11-16 ENCOUNTER — Ambulatory Visit (HOSPITAL_COMMUNITY)
Admission: RE | Admit: 2014-11-16 | Discharge: 2014-11-16 | Disposition: A | Discharge status: Home / Self Care | Payer: Managed Care, Other (non HMO) | Source: Ambulatory Visit | Attending: Family Medicine | Admitting: Family Medicine

## 2014-11-16 ENCOUNTER — Encounter (HOSPITAL_COMMUNITY): Payer: Self-pay

## 2014-11-16 ENCOUNTER — Ambulatory Visit (INDEPENDENT_AMBULATORY_CARE_PROVIDER_SITE_OTHER): Payer: Managed Care, Other (non HMO) | Admitting: Family Medicine

## 2014-11-16 VITALS — BP 110/76 | HR 84 | Temp 98.1°F | Resp 16 | Ht 61.0 in | Wt 153.4 lb

## 2014-11-16 DIAGNOSIS — R110 Nausea: Secondary | ICD-10-CM

## 2014-11-16 DIAGNOSIS — R1013 Epigastric pain: Secondary | ICD-10-CM | POA: Diagnosis present

## 2014-11-16 DIAGNOSIS — R197 Diarrhea, unspecified: Secondary | ICD-10-CM | POA: Insufficient documentation

## 2014-11-16 DIAGNOSIS — D649 Anemia, unspecified: Secondary | ICD-10-CM

## 2014-11-16 DIAGNOSIS — M549 Dorsalgia, unspecified: Secondary | ICD-10-CM | POA: Diagnosis not present

## 2014-11-16 DIAGNOSIS — R109 Unspecified abdominal pain: Secondary | ICD-10-CM | POA: Diagnosis not present

## 2014-11-16 DIAGNOSIS — D72829 Elevated white blood cell count, unspecified: Secondary | ICD-10-CM

## 2014-11-16 DIAGNOSIS — R509 Fever, unspecified: Secondary | ICD-10-CM | POA: Diagnosis not present

## 2014-11-16 DIAGNOSIS — R1084 Generalized abdominal pain: Secondary | ICD-10-CM

## 2014-11-16 LAB — POCT UA - MICROSCOPIC ONLY
Casts, Ur, LPF, POC: NEGATIVE
Crystals, Ur, HPF, POC: NEGATIVE
Yeast, UA: NEGATIVE

## 2014-11-16 LAB — POCT CBC
Granulocyte percent: 70.6 %G (ref 37–80)
HCT, POC: 34.1 % — AB (ref 37.7–47.9)
Hemoglobin: 10.7 g/dL — AB (ref 12.2–16.2)
Lymph, poc: 3.7 — AB (ref 0.6–3.4)
MCH, POC: 25.3 pg — AB (ref 27–31.2)
MCHC: 31.3 g/dL — AB (ref 31.8–35.4)
MCV: 80.9 fL (ref 80–97)
MID (cbc): 0.3 (ref 0–0.9)
MPV: 7.4 fL (ref 0–99.8)
POC Granulocyte: 9.7 — AB (ref 2–6.9)
POC LYMPH PERCENT: 26.9 %L (ref 10–50)
POC MID %: 2.5 %M (ref 0–12)
Platelet Count, POC: 362 10*3/uL (ref 142–424)
RBC: 4.21 M/uL (ref 4.04–5.48)
RDW, POC: 14.2 %
WBC: 13.7 10*3/uL — AB (ref 4.6–10.2)

## 2014-11-16 LAB — POCT URINALYSIS DIPSTICK
Bilirubin, UA: NEGATIVE
Glucose, UA: NEGATIVE
Ketones, UA: NEGATIVE
Leukocytes, UA: NEGATIVE
Nitrite, UA: NEGATIVE
Spec Grav, UA: 1.02
Urobilinogen, UA: 0.2
pH, UA: 7

## 2014-11-16 MED ORDER — IOHEXOL 300 MG/ML  SOLN
50.0000 mL | Freq: Once | INTRAMUSCULAR | Status: AC | PRN
  Administered 2014-11-16: 50 mL via ORAL

## 2014-11-16 MED ORDER — IOHEXOL 300 MG/ML  SOLN
100.0000 mL | Freq: Once | INTRAMUSCULAR | Status: AC | PRN
  Administered 2014-11-16: 100 mL via INTRAVENOUS

## 2014-11-16 NOTE — Progress Notes (Signed)
Chief Complaint:  Chief Complaint  Patient presents with  . Abdominal Pain    x 8 days  . Diarrhea  . Fever  . Generalized Body Aches    tried otc medication and some relief for the body aches  . Bloated    Buring feeling in stomach, every 10 to 15 minutes    HPI: Julie Herman is a 39 y.o. female who is here for abd pain in the midpeigastric area x 8 days and then also in back on Sunday, subjective fevers, generlaized  Pain in belly button all the way up, cramps and then tightens and then goes away, ten to 15 minutes. Last bowel movement was today, she had diarrhea, She ahd diarrhea nd then today not normal. She is not digesting right. Today stool was like liquid. She had a very bad smell to it. She has had no recent travels, drinks only   Bottle water. Her nephew had similar sxs about 1 week ago.  When she eats she feels nauseated but otherqwise none, she eats whatever she wants.  She has had decreased appetite. She has soemtimes for a little bit her stomach growls and tightens.  No blood in urine or stool. No rashes. She has had no hx of SBO, has had no abd surgery. Had been getting hiccups, and passsing gas. No urinary symptoms. Pain worse after eating. She has had poor PO.   No IBD, UC, crohns, colon cancer  [place]  She had very heavy periods this time, LMP May 10 , 5-6 days of clots.  She has had heavy periods. She was on birth control 5 months ago, she has had abnormally heavy periods with a lot of pain.   History reviewed. No pertinent past medical history. History reviewed. No pertinent past surgical history. History   Social History  . Marital Status: Married    Spouse Name: N/A  . Number of Children: N/A  . Years of Education: N/A   Social History Main Topics  . Smoking status: Never Smoker   . Smokeless tobacco: Not on file  . Alcohol Use: Yes  . Drug Use: No  . Sexual Activity: Not on file   Other Topics Concern  . None    Social History Narrative   History reviewed. No pertinent family history. No Known Allergies Prior to Admission medications   Medication Sig Start Date End Date Taking? Authorizing Provider  cyclobenzaprine (FLEXERIL) 10 MG tablet Take 1 tablet (10 mg total) by mouth at bedtime. Patient not taking: Reported on 11/16/2014 08/01/14   Sherren MochaEva N Shaw, MD  naproxen (NAPROSYN) 500 MG tablet Take 1 tablet (500 mg total) by mouth 2 (two) times daily with a meal. Patient not taking: Reported on 11/16/2014 08/01/14   Sherren MochaEva N Shaw, MD     ROS: The patient denies  night sweats, unintentional weight loss, chest pain, palpitations, wheezing, dyspnea on exertion, nausea, vomiting, abdominal pain, dysuria, hematuria, melena, numbness,  or tingling.   All other systems have been reviewed and were otherwise negative with the exception of those mentioned in the HPI and as above.    PHYSICAL EXAM: Filed Vitals:   11/16/14 1559  BP: 110/76  Pulse: 84  Temp: 98.1 F (36.7 C)  Resp: 16   Filed Vitals:   11/16/14 1559  Height: 5\' 1"  (1.549 m)  Weight: 153 lb 6.4 oz (69.582 kg)   Body mass index is 29 kg/(m^2).  General: Alert, no  acute distress HEENT:  Normocephalic, atraumatic, oropharynx patent. EOMI, PERRLA, no scleral icterus.  Cardiovascular:  Regular rate and rhythm, no rubs murmurs or gallops.  No Carotid bruits, radial pulse intact. No pedal edema.  Respiratory: Clear to auscultation bilaterally.  No wheezes, rales, or rhonchi.  No cyanosis, no use of accessory musculature GI: No organomegaly, abdomen is soft and + diffusely tender, positive bowel sounds.  No masses. No peritonitis Skin: No rashes. Neurologic: Facial musculature symmetric. Psychiatric: Patient is appropriate throughout our interaction. Lymphatic: No cervical lymphadenopathy Musculoskeletal: Gait intact.   LABS: Results for orders placed or performed in visit on 11/16/14  POCT CBC  Result Value Ref Range   WBC 13.7 (A) 4.6  - 10.2 K/uL   Lymph, poc 3.7 (A) 0.6 - 3.4   POC LYMPH PERCENT 26.9 10 - 50 %L   MID (cbc) 0.3 0 - 0.9   POC MID % 2.5 0 - 12 %M   POC Granulocyte 9.7 (A) 2 - 6.9   Granulocyte percent 70.6 37 - 80 %G   RBC 4.21 4.04 - 5.48 M/uL   Hemoglobin 10.7 (A) 12.2 - 16.2 g/dL   HCT, POC 16.134.1 (A) 09.637.7 - 47.9 %   MCV 80.9 80 - 97 fL   MCH, POC 25.3 (A) 27 - 31.2 pg   MCHC 31.3 (A) 31.8 - 35.4 g/dL   RDW, POC 04.514.2 %   Platelet Count, POC 362 142 - 424 K/uL   MPV 7.4 0 - 99.8 fL  POCT UA - Microscopic Only  Result Value Ref Range   WBC, Ur, HPF, POC 1-3    RBC, urine, microscopic 15-20    Bacteria, U Microscopic trace    Mucus, UA 1+    Epithelial cells, urine per micros 2-4    Crystals, Ur, HPF, POC neg    Casts, Ur, LPF, POC neg    Yeast, UA neg   POCT urinalysis dipstick  Result Value Ref Range   Color, UA yellow    Clarity, UA clear    Glucose, UA neg    Bilirubin, UA neg    Ketones, UA neg    Spec Grav, UA 1.020    Blood, UA moderate    pH, UA 7.0    Protein, UA trace    Urobilinogen, UA 0.2    Nitrite, UA neg    Leukocytes, UA Negative      EKG/XRAY:   [facility]. No obvious obstruction Please comment if there is hepatomegaly on xr     ASSESSMENT/PLAN: Encounter Diagnoses  Name Primary?  . Abdominal pain, epigastric Yes  . Nausea without vomiting   . Diarrhea   . Back pain, unspecified location   . Diffuse abdominal pain   . Anemia, unspecified anemia type   . Leukocytosis    Possible etiologies of leukocytosis with abdominal pain include gallbladder in etiology appendicitis, diverticulitis, uterine fibroids vs colitis. Her anemia may be related to fibroids if there are fibroids or diverticular in origin Labs and stat CT pending Follow-up with CT scan results Advised to push fluids at home.  Gross sideeffects, risk and benefits, and alternatives of medications d/w patient. Patient is aware that all medications have potential  sideeffects and we are unable to predict every sideeffect or drug-drug interaction that may occur.  Hamilton CapriLE, Ramonia Mcclaran PHUONG, DO 11/16/2014 7:39 PM

## 2014-11-16 NOTE — Patient Instructions (Signed)
Please report to [facility] **Register in the Emergency Department as an outpatient---Let them know you are there for an scheduled CT

## 2014-11-17 ENCOUNTER — Telehealth: Payer: Self-pay | Admitting: Family Medicine

## 2014-11-17 ENCOUNTER — Telehealth: Payer: Self-pay

## 2014-11-17 DIAGNOSIS — K529 Noninfective gastroenteritis and colitis, unspecified: Secondary | ICD-10-CM

## 2014-11-17 LAB — COMPLETE METABOLIC PANEL WITH GFR
ALT: 18 U/L (ref 0–35)
AST: 22 U/L (ref 0–37)
Albumin: 3.6 g/dL (ref 3.5–5.2)
Alkaline Phosphatase: 99 U/L (ref 39–117)
BUN: 7 mg/dL (ref 6–23)
CO2: 24 mEq/L (ref 19–32)
Calcium: 8.9 mg/dL (ref 8.4–10.5)
Chloride: 104 mEq/L (ref 96–112)
Creat: 0.55 mg/dL (ref 0.50–1.10)
GFR, Est African American: >89 mL/min
GFR, Est Non African American: >89 mL/min
Glucose, Bld: 66 mg/dL — ABNORMAL LOW (ref 70–99)
Potassium: 3.7 mEq/L (ref 3.5–5.3)
Sodium: 139 mEq/L (ref 135–145)
Total Bilirubin: 0.4 mg/dL (ref 0.2–1.2)
Total Protein: 7 g/dL (ref 6.0–8.3)

## 2014-11-17 LAB — LIPASE: Lipase: 20 U/L (ref 0–75)

## 2014-11-17 LAB — IRON AND TIBC
%SAT: 3 % — ABNORMAL LOW (ref 20–55)
Iron: 10 ug/dL — ABNORMAL LOW (ref 42–145)
TIBC: 348 ug/dL (ref 250–470)
UIBC: 338 ug/dL (ref 125–400)

## 2014-11-17 LAB — IFOBT (OCCULT BLOOD): IFOBT: NEGATIVE

## 2014-11-17 LAB — FERRITIN: Ferritin: 33 ng/mL (ref 10–291)

## 2014-11-17 MED ORDER — CIPROFLOXACIN HCL 500 MG PO TABS: 500.0000 mg | ORAL_TABLET | Freq: Two times a day (BID) | ORAL | Status: DC

## 2014-11-17 MED ORDER — METRONIDAZOLE 500 MG PO TABS: 500.0000 mg | ORAL_TABLET | Freq: Two times a day (BID) | ORAL | Status: DC

## 2014-11-17 NOTE — Telephone Encounter (Signed)
-----   Message from Lenell Antuhao P Le, DO sent at 11/17/2014  8:06 AM EDT ----- Regarding: FW: Please call her and tell her I need her to submit a stool specimen before I can give antibiotics.   Will call her with CT results in depth in 2 hours  Dr Conley RollsLe ----- Message -----    From: Rad Results In Interface    Sent: 11/16/2014  11:23 PM      To: Thao P Le, DO

## 2014-11-17 NOTE — Telephone Encounter (Signed)
Pt is not feeling any better and did not go to work,she is wanting advice as to when she should go back to work???   Best phone is [phone]    PT WILL PICK UP OOW NOTE WHEN READY

## 2014-11-17 NOTE — Telephone Encounter (Signed)
Spoke with Julie Herman her niece and translated, will call in flagyl, cipro and laso add ESR and refer to GI, work note in letter section

## 2014-11-17 NOTE — Telephone Encounter (Signed)
Pt's niece, Dois DavenportSandra, called back for aunt regarding request for antibiotics. Pt verified and confirmed ok to speak to her niece. Advised per Dr. Conley RollsLe,  stool specimen is needed before antibiotics can be prescribed. Advised also that Dr. Conley RollsLe will be calling to go over CT results in depth in  2 hours.  Niece translated to pt, both understood. Niece advised a stool specimen will be obtained and brought to 102 as advised.

## 2014-11-17 NOTE — Telephone Encounter (Signed)
Spoke with patient gave her message and she told me to call husband he hung up wife says she really dont understand what im talking about

## 2014-11-18 ENCOUNTER — Telehealth: Payer: Self-pay

## 2014-11-18 ENCOUNTER — Telehealth: Payer: Self-pay | Admitting: Family Medicine

## 2014-11-18 LAB — OVA AND PARASITE EXAMINATION

## 2014-11-18 NOTE — Telephone Encounter (Signed)
Patients niece Dois DavenportSandra is calling to see is the work note it ready for pick up. Please call when ready.

## 2014-11-18 NOTE — Telephone Encounter (Signed)
-----  Message from Glenford Bayley, DO sent at 11/17/2014  4:58 PM EDT ----- Regarding: Add ESR  Can you add an ESR to her previous blood work. Same dx codes  Thanks

## 2014-11-18 NOTE — Telephone Encounter (Signed)
Called [facility] they couldn't add lab onto test they only keep it for 24 hours for a ERS

## 2014-11-18 NOTE — Telephone Encounter (Signed)
Yes in pick up draw. Pt already picked it up/

## 2014-11-19 ENCOUNTER — Telehealth: Payer: Self-pay | Admitting: Family Medicine

## 2014-11-19 NOTE — Telephone Encounter (Signed)
Spoke with niece about labs and would like her to ask Julie Herman how she is doing Iron is low , she has iron def anemia.  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

## 2014-11-21 LAB — STOOL CULTURE

## 2015-05-10 ENCOUNTER — Other Ambulatory Visit: Payer: Self-pay

## 2015-05-10 DIAGNOSIS — Z1231 Encounter for screening mammogram for malignant neoplasm of breast: Secondary | ICD-10-CM

## 2015-06-02 ENCOUNTER — Ambulatory Visit
Admission: RE | Admit: 2015-06-02 | Discharge: 2015-06-02 | Disposition: A | Discharge status: Home / Self Care | Payer: Managed Care, Other (non HMO) | Source: Ambulatory Visit

## 2015-06-02 ENCOUNTER — Ambulatory Visit (INDEPENDENT_AMBULATORY_CARE_PROVIDER_SITE_OTHER): Payer: Managed Care, Other (non HMO) | Admitting: Physician Assistant

## 2015-06-02 VITALS — BP 106/62 | HR 54 | Temp 98.2°F | Resp 16 | Ht 61.0 in | Wt 155.0 lb

## 2015-06-02 DIAGNOSIS — M25529 Pain in unspecified elbow: Secondary | ICD-10-CM | POA: Diagnosis not present

## 2015-06-02 DIAGNOSIS — N926 Irregular menstruation, unspecified: Secondary | ICD-10-CM

## 2015-06-02 DIAGNOSIS — Z1231 Encounter for screening mammogram for malignant neoplasm of breast: Secondary | ICD-10-CM

## 2015-06-02 LAB — POCT CBC
Granulocyte percent: 55.9 %G (ref 37–80)
HCT, POC: 34 % — AB (ref 37.7–47.9)
Hemoglobin: 11.2 g/dL — AB (ref 12.2–16.2)
Lymph, poc: 4 — AB (ref 0.6–3.4)
MCH, POC: 24 pg — AB (ref 27–31.2)
MCHC: 32.9 g/dL (ref 31.8–35.4)
MCV: 72.9 fL — AB (ref 80–97)
MID (cbc): 0.5 (ref 0–0.9)
MPV: 7.9 fL (ref 0–99.8)
POC Granulocyte: 5.8 (ref 2–6.9)
POC LYMPH PERCENT: 38.9 %L (ref 10–50)
POC MID %: 5.2 %M (ref 0–12)
Platelet Count, POC: 336 10*3/uL (ref 142–424)
RBC: 4.66 M/uL (ref 4.04–5.48)
RDW, POC: 17.2 %
WBC: 10.3 10*3/uL — AB (ref 4.6–10.2)

## 2015-06-02 LAB — POCT WET + KOH PREP
Trich by wet prep: ABSENT
Yeast by KOH: ABSENT
Yeast by wet prep: ABSENT

## 2015-06-02 LAB — POCT URINE PREGNANCY: Preg Test, Ur: NEGATIVE

## 2015-06-02 MED ORDER — MELOXICAM 7.5 MG PO TABS: 7.5000 mg | ORAL_TABLET | Freq: Every day | ORAL | Status: DC

## 2015-06-02 NOTE — Patient Instructions (Addendum)
Let's ice the elbows area daily after work.  I would like you to do stretches daily.  3-4 times per day.   You can try using wrist braces that you can get at any pharmacy, [facility], or [employer].  This will help with the numbing pain.  If this does not improve in 1 month, let me know, and we can refer you to an orthopedist. Take the mobic daily.  Do not take naproxen, ibuprofen with this medicine.  You can take tylenol. I am referring you to an ob/gyn for this irregular menstrual bleeding.  You may need an hormone pill at this time, but if you want to get pregnant, this may not be the option for you at this time.   Generic Elbow Exercises EXERCISES RANGE OF MOTION (ROM) AND STRETCHING EXERCISES  These exercises may help you when beginning to rehabilitate your injury. Your symptoms may go away with or without further involvement from your physician, physical therapist or athletic trainer. While completing these exercises, remember:   Restoring tissue flexibility helps normal motion to return to the joints. This allows healthier, less painful movement and activity.  An effective stretch should be held for at least 30 seconds.  A stretch should never be painful. You should only feel a gentle lengthening or release in the stretched tissue. RANGE OF MOTION - Extension  Hold your right / left arm at your side and straighten your elbow as far as you can, using your right / left arm muscles.  Straighten the elbow farther by gently pushing down on your forearm until you feel a gentle stretch on the inside of your elbow. Hold this position for __________ seconds.  Slowly return to the starting position. Repeat __________ times. Complete this exercise __________ times per day.  RANGE OF MOTION - Flexion  Hold your right / left arm at your side and bend your elbow as far as you can using your arm muscles.  Bend the right / left elbow farther by gently pushing up on your forearm until you feel a gentle  stretch on the outside of your elbow. Hold this position for __________ seconds.  Slowly return to the starting position. Repeat __________ times. Complete this exercise __________ times per day.  RANGE OF MOTION - Supination, Active  Stand or sit with your elbows at your side. Bend your right / left elbow to 90 degrees.  Turn your palm upward until you feel a gentle stretch on the inside of your forearm.  Hold this position for __________ seconds. Slowly release and return to the starting position. Repeat __________ times. Complete this stretch __________ times per day.  RANGE OF MOTION - Pronation, Active  Stand or sit with your elbows at your side. Bend your right / left elbow to 90 degrees.  Turn your palm downward until you feel a gentle stretch on the top of your forearm.  Hold this position for __________ seconds. Slowly release and return to the starting position. Repeat __________ times. Complete this stretch __________ times per day.  STRETCH - Elbow Flexors  Lie on a firm bed or countertop, on your back. Be sure that you are in a comfortable position which will allow you to relax your arm muscles.  Place a folded towel under your right / left upper arm, so that your elbow and shoulder are at the same height. Extend your arm; your elbow should not rest on the bed or towel  Allow the weight of your hand to straighten  your elbow. Keep your arm and chest muscles relaxed. Your caregiver may ask you to increase the intensity of your stretch by adding a small wrist or hand weight.  Hold for __________ seconds. You should feel a stretch on the inside of your elbow. Slowly return to the starting position. Repeat __________ times. Complete this exercise __________ times per day. STRENGTHENING EXERCISES  These exercises may help you when beginning to rehabilitate your injury. They may resolve your symptoms with or without further involvement from your physician, physical therapist or  athletic trainer. While completing these exercises, remember:   Muscles can gain both the endurance and the strength needed for everyday activities through controlled exercises.  Complete these exercises as instructed by your physician, physical therapist or athletic trainer. Increase the resistance and repetitions only as guided.  You may experience muscle soreness or fatigue, but the pain or discomfort you are trying to eliminate should never worsen during these exercises. If this pain does get worse, stop and make sure you are following the directions exactly. If the pain is still present after adjustments, discontinue the exercise until you can discuss the trouble with your caregiver. STRENGTH - Elbow Flexors, Isometric   Stand or sit upright on a firm surface. Place your right / left arm so that your hand is palm-up and at the height of your waist.  Place your opposite hand on top of your forearm. Gently push down as your right / left arm resists. Push as hard as you can with both arms without causing any pain or movement at your right / left elbow. Hold this stationary position for __________ seconds.  Gradually release the tension in both arms. Allow your muscles to relax completely before repeating. Repeat __________ times. Complete this exercise __________ times per day. STRENGTH - Elbow Extensors, Isometric  Stand or sit upright on a firm surface. Place your right / left arm so that your palm faces your abdomen and it is at the height of your waist.  Place your opposite hand on the underside of your forearm. Gently push up as your right / left arm resists. Push as hard as you can with both arms without causing any pain or movement at your right / left elbow. Hold this stationary position for __________ seconds.  Gradually release the tension in both arms. Allow your muscles to relax completely before repeating. Repeat __________ times. Complete this exercise __________ times per  day. STRENGTH - Elbow Flexors, Supinated  With good posture, stand, or sit on a firm chair without armrests. Allow your right / left arm to rest at your side with your palm facing forward.  Holding a __________ weight, or gripping a rubber exercise band or tubing, bring your hand toward your shoulder.  Allow your muscles to control the resistance, as your hand returns to your side. Repeat __________ times. Complete this exercise __________ times per day.  STRENGTH - Elbow Extensors, Dynamic  With good posture, stand, or sit on a firm chair without armrests. Keeping your upper arms at your side, bring both hands up toward your right / left shoulder while gripping a rubber exercise band or tubing. Your right / left hand should be just below the other hand.  Straighten your right / left elbow. Hold for __________ seconds.  Allow your muscles to control the rubber exercise band, as your hand returns to your shoulder. Repeat __________ times. Complete this exercise __________ times per day. STRENGTH - Forearm Supinators   Sit with your  right / left forearm supported on a table, keeping your elbow below shoulder height. Rest your hand over the edge, palm down.  Gently grip a hammer or a soup ladle.  Without moving your elbow, slowly turn your palm and hand upward to a "thumbs-up" position.  Hold this position for __________ seconds. Slowly return to the starting position. Repeat __________ times. Complete this exercise __________ times per day.  STRENGTH - Forearm Pronators  Sit with your right / left forearm supported on a table, keeping your elbow below shoulder height. Rest your hand over the edge, palm up.  Gently grip a hammer or a soup ladle.  Without moving your elbow, slowly turn your palm and hand upward to a "thumbs-up" position.  Hold this position for __________ seconds. Slowly return to the starting position. Repeat __________ times. Complete this exercise __________ times  per day.   This information is not intended to replace advice given to you by your health care provider. Make sure you discuss any questions you have with your health care provider.   Document Released: 05/03/2005 Document Revised: 07/10/2014 Document Reviewed: 10/01/2008 Elsevier Interactive Patient Education [employer]

## 2015-06-02 NOTE — Progress Notes (Signed)
[facility] [address] [place] [address] [phone]  Date:  06/02/2015   Name:  Julie Herman   DOB:  11-02-1975   MRN:  [id number]  PCP:  No PCP Per Patient    History of Present Illness:  Julie Herman is a 39 y.o. female patient who presents to [facility] with cc of bilateral flexor elbow pain, and irregular menses.   Flexor area of the arms are painful bilaterally.  She has pins and needles with both hands at her thumb, index and middle finger.  First, right arm then progressed to both arms.  Right arm has had these symptoms for 8 years.  Left arm started about 1-2 years ago.  She denies swelling or redness.   She works as a Location manager.  She performs repetitive movement with arms, flexing and bending to lift objects over from machine--for 8 hours.  This has been for 12 years.  She had stopped for a year, where she states the pain was less, and had ultimately improved.  No hx of rheumatologic disease.  Sister has osteoporosis.   Patient also complains of 5 months of irregular menses.  She has a full 5 days of a normal cycle, however 1 week later, she will have vaginal bleeding for 2-3 days.  This may occur 2-3 times per month.  Dyspareunia with the intercourse.  No early satiety, but may feel bloated at times.   Cycle is generally normal.  No heavy bleeding or cramping.  She states that she believes she may be having hot flashes.    She has children, ages 56,21,15 and 63.  No surgery or abnormal findings.  She has taken ocp in the past which may have possibly helped with cycle though she is unsure.   Sexually active with husbands.  No OCP or condoms.     ---Through review, patient was seen here 08/01/2014, with discussion of an irregular menses though she states that her irregularity was only 5 months ago.  Her pelvic exam was stated to have... "Normal labia and vagina but cervix was mildly friable with wide and obvious transition zone on exterior cervix with  several nabothian cysts. No cervical motion tenderness. Uterus is antiverted, small, mobile, but mildly TTP with no adenexal enlargement or tenderness."   Transvaginal US and pelvic were unremarkable.      Patient Active Problem List   Diagnosis Date Noted  . Language barrier 09/14/2014  . Hyperlipidemia 04/10/2014    Past Medical History  Diagnosis Date  . Neuromuscular disorder (HCC)     History reviewed. No pertinent past surgical history.  Social History  Substance Use Topics  . Smoking status: Never Smoker   . Smokeless tobacco: None  . Alcohol Use: No    Family History  Problem Relation Age of Onset  . Heart disease Mother   . Diabetes Brother   . Cancer Maternal Uncle     No Known Allergies  Medication list has been reviewed and updated.  No current outpatient prescriptions on file prior to visit.   No current facility-administered medications on file prior to visit.    ROS ROS otherwise unremarkable unless listed above.  Physical Examination: BP 106/62 mmHg  Pulse 54  Temp(Src) 98.2 F (36.8 C) (Oral)  Resp 16  Ht  (1.549 m)  Wt 155 lb (70.308 kg)  BMI 29.30 kg/m2  SpO2 99%  LMP 05/24/2015 Ideal Body Weight: Weight in (lb) to have BMI = 25:  132  Physical Exam  . Results for orders placed or performed in visit on 06/02/15  POCT CBC  Result Value Ref Range   WBC 10.3 (A) 4.6 - 10.2 K/uL   Lymph, poc 4.0 (A) 0.6 - 3.4   POC LYMPH PERCENT 38.9 10 - 50 %L   MID (cbc) 0.5 0 - 0.9   POC MID % 5.2 0 - 12 %M   POC Granulocyte 5.8 2 - 6.9   Granulocyte percent 55.9 37 - 80 %G   RBC 4.66 4.04 - 5.48 M/uL   Hemoglobin 11.2 (A) 12.2 - 16.2 g/dL   HCT, POC 16.134.0 (A) 09.637.7 - 47.9 %   MCV 72.9 (A) 80 - 97 fL   MCH, POC 24.0 (A) 27 - 31.2 pg   MCHC 32.9 31.8 - 35.4 g/dL   RDW, POC 04.517.2 %   Platelet Count, POC 336 142 - 424 K/uL   MPV 7.9 0 - 99.8 fL  POCT Wet + KOH Prep  Result Value Ref Range   Yeast by KOH Absent Present, Absent   Yeast  by wet prep Absent Present, Absent   WBC by wet prep None None, Few, Too numerous to count   Clue Cells Wet Prep HPF POC None None, Too numerous to count   Trich by wet prep Absent Present, Absent   Bacteria Wet Prep HPF POC Few None, Few, Too numerous to count   Epithelial Cells By [employer]et Pref ([facility]) Few None, Few, Too numerous to count   RBC,UR,HPF,POC Too numerous to count  (A) None RBC/hpf  POCT urine pregnancy  Result Value Ref Range   Preg Test, Ur Negative Negative      Assessment and Plan: Julie Herman is a 39 y.o. female who is here today for cc of irregular menses, and arm pain.   -Arm pain appears tendinitis like.  I have issued stretches, icing, and mobic.  She contact in 1 month if sxs do not resolve.  This may be ongoing in her occupational duties.   -Irregular menses--suggesting ocp, or mirena at this time, however she is stating that she may want more children.  At this time, gynecology consult is appreciated.  TSH normal within a year ago.   Elbow pain, unspecified laterality - Plan: meloxicam (MOBIC) 7.5 MG tablet  Irregular menses - Plan: POCT CBC, POCT Wet + KOH Prep, Ambulatory referral to Gynecology  Irregular bleeding - Plan: POCT urine pregnancy  Trena PlattStephanie English, PA-C [facility] 06/02/2015 8:56 AM

## 2015-06-03 NOTE — Progress Notes (Signed)
  Medical screening examination/treatment/procedure(s) were performed by non-physician practitioner and as supervising physician I was immediately available for consultation/collaboration.     

## 2016-02-04 IMAGING — CT CT ABD-PELV W/ CM
2 of 5 series · 16 of 46 positions shown, 18 images · IV contrast (omnipaque)
Comparison: Pelvic ultrasound performed 08/10/2014

CLINICAL DATA: Acute onset of mid epigastric abdominal pain. Fever.
Diarrhea and decreased appetite. Initial encounter.

EXAM:
CT ABDOMEN AND PELVIS WITH CONTRAST
TECHNIQUE: Multidetector CT imaging of the abdomen and pelvis was performed
using the standard protocol following bolus administration of
intravenous contrast.
CONTRAST:  100mL OMNIPAQUE IOHEXOL 300 MG/ML  SOLN

[Series 2: rtn a/p with · axial · 0.74mm/px · z∈[-430,-20]mm · 13 of 94 slices shown, 15 images]
[im 6/94  soft-tissue]
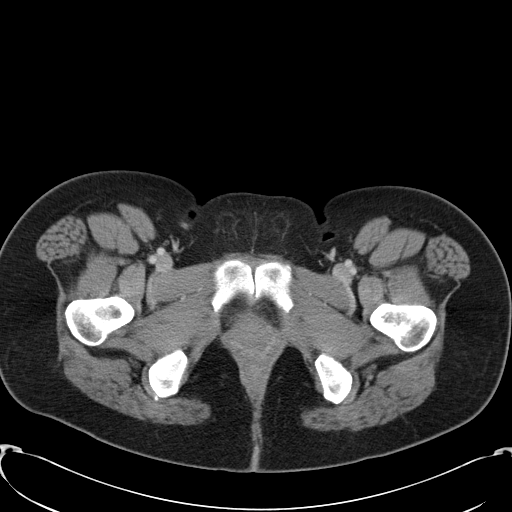
[im 6/94  bone]
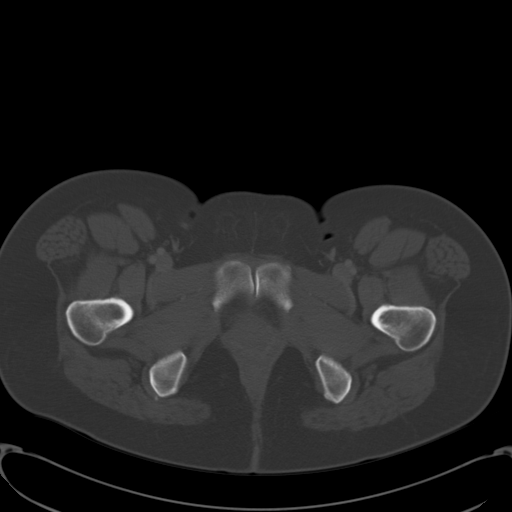
[im 11/94  soft-tissue]
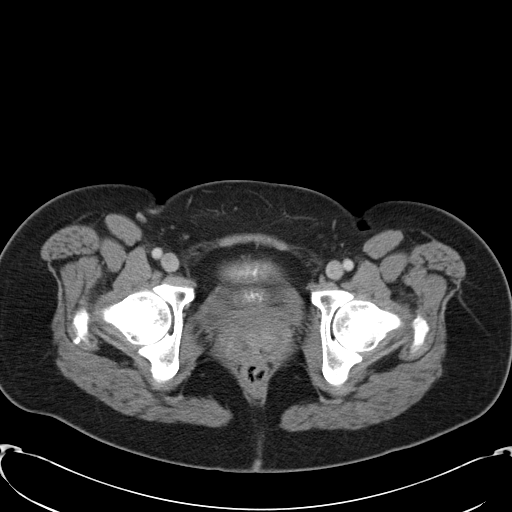
[im 21/94  soft-tissue]
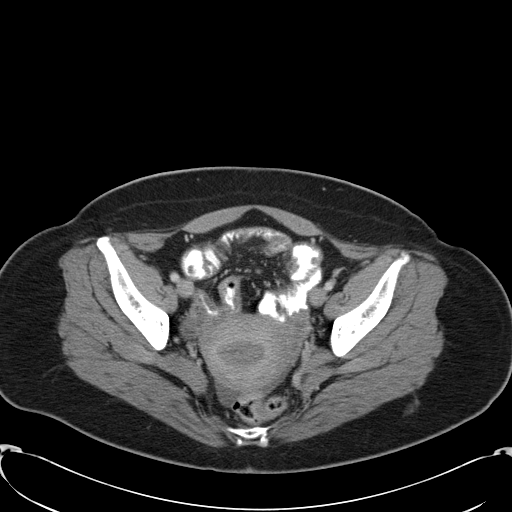
[im 26/94  soft-tissue]
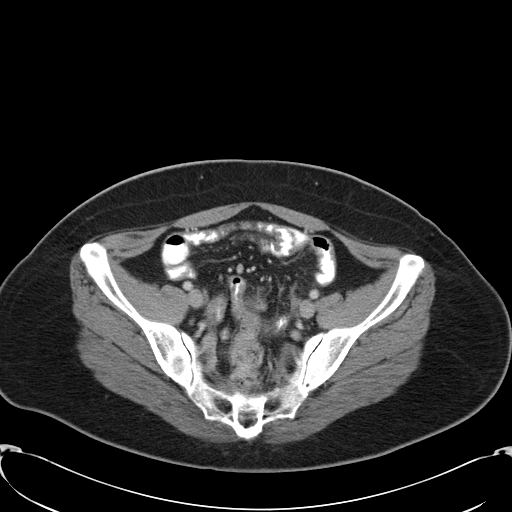
[im 32/94  soft-tissue]
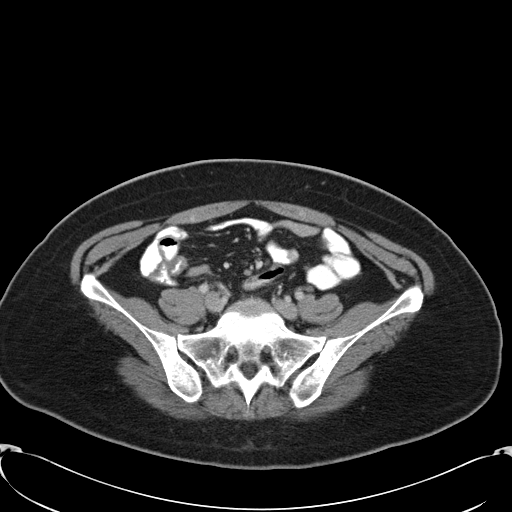
[im 42/94  soft-tissue]
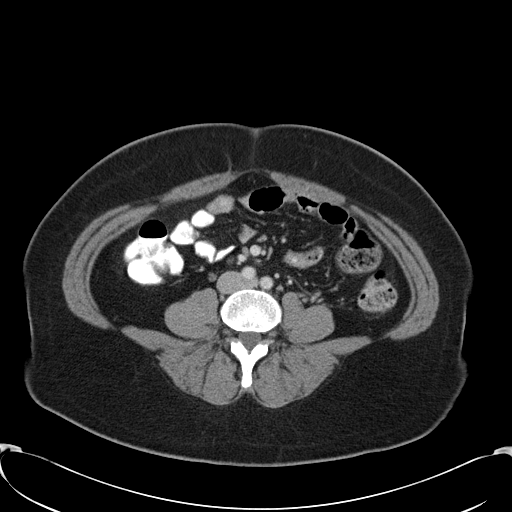
[im 47/94  soft-tissue]
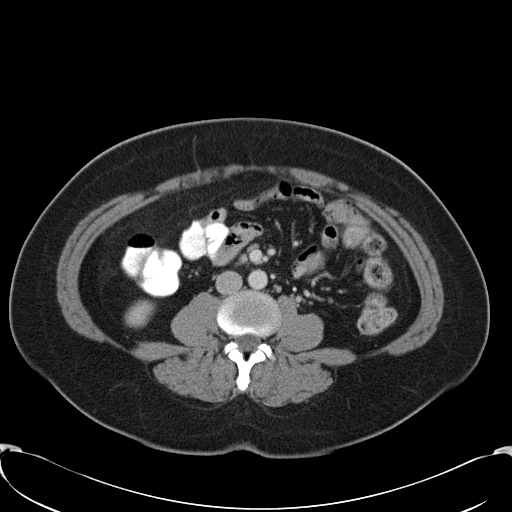
[im 52/94  soft-tissue]
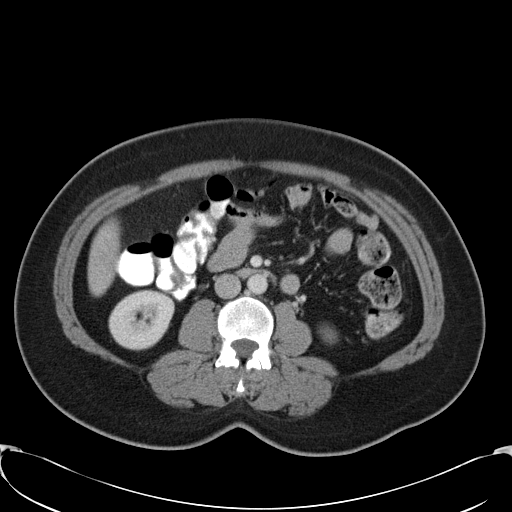
[im 63/94  soft-tissue]
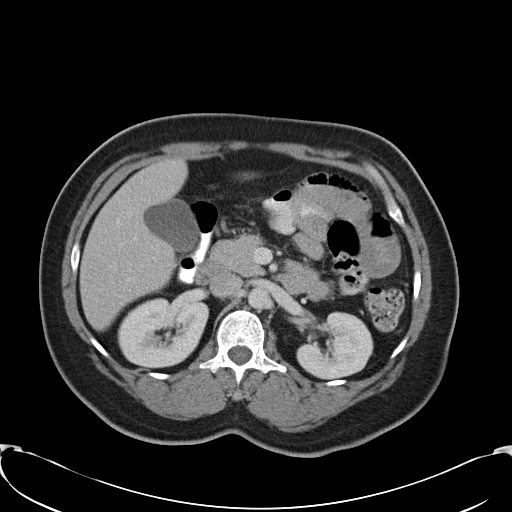
[im 63/94  bone]
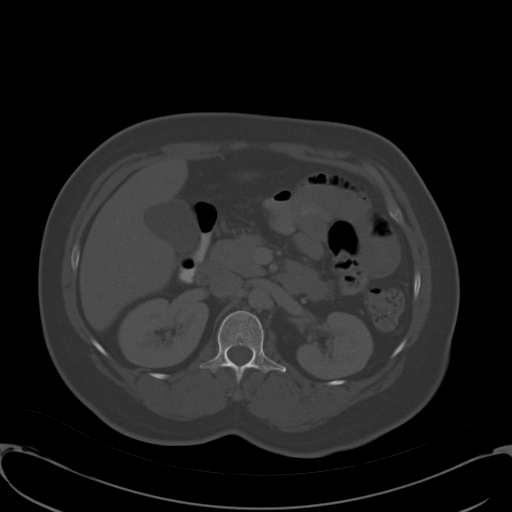
[im 68/94  soft-tissue]
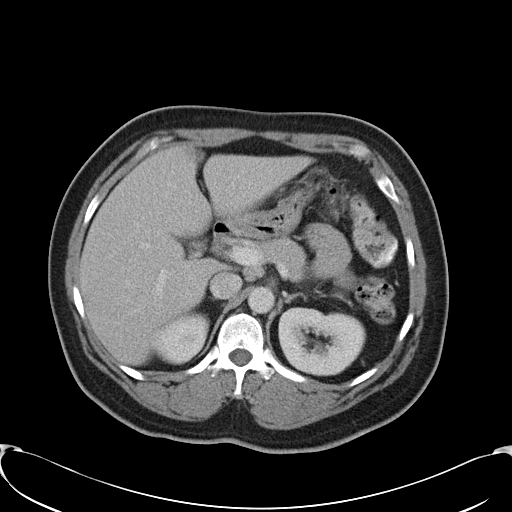
[im 73/94  soft-tissue]
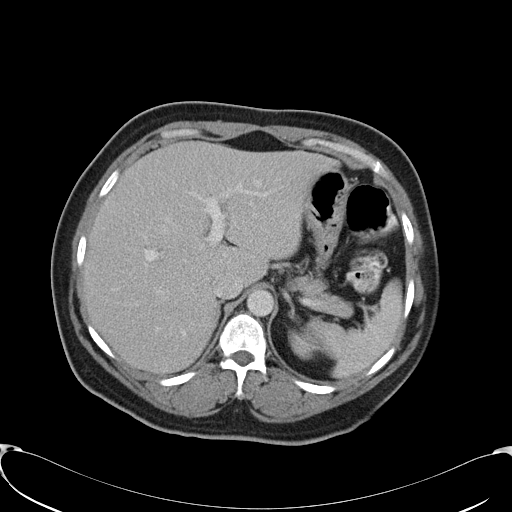
[im 83/94  soft-tissue]
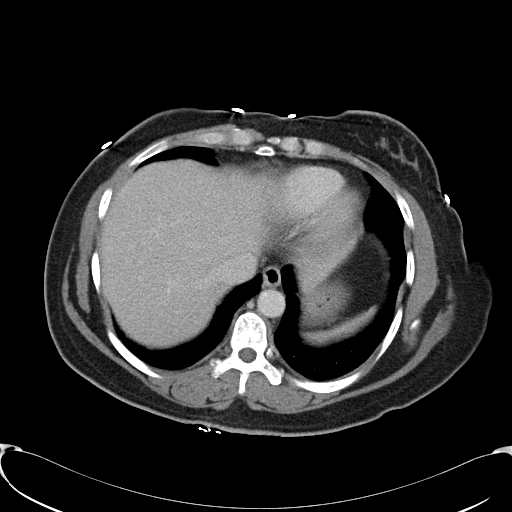
[im 88/94  soft-tissue]
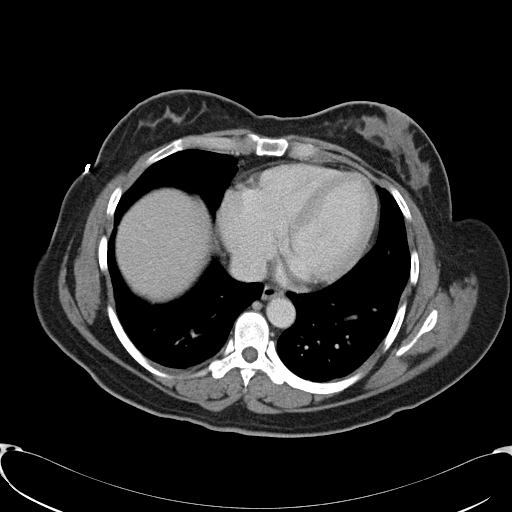

[Series 602: <mpr thick range> · coronal · 0.91mm/px · 3 of 145 slices shown]
[im 49/145  soft-tissue]
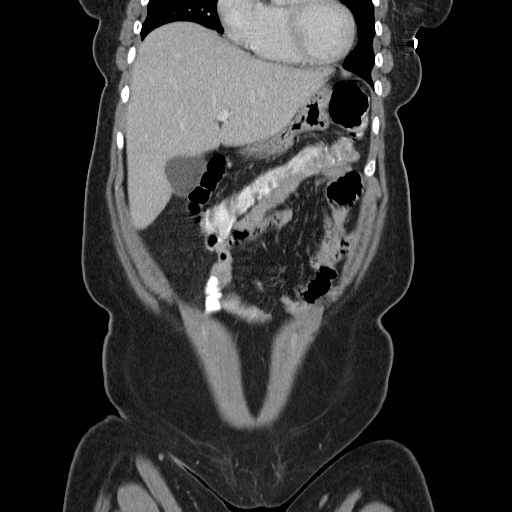
[im 65/145  soft-tissue]
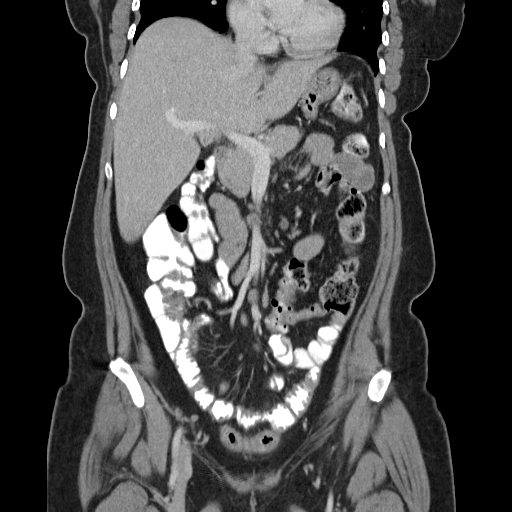
[im 81/145  soft-tissue]
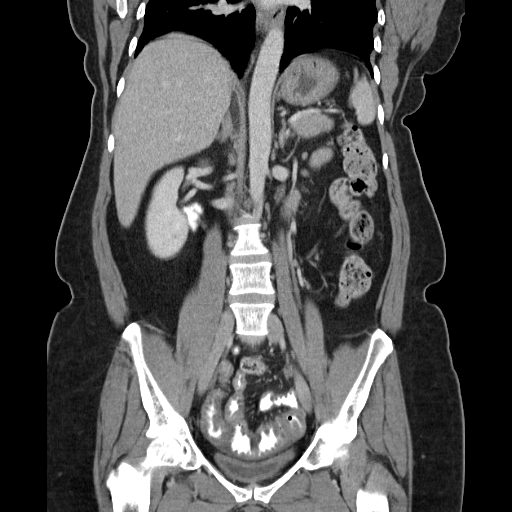

[16 of 46 positions shown; findings below may reference images not displayed]

FINDINGS: The visualized lung bases are clear.

The liver and spleen are unremarkable in appearance. The gallbladder
is within normal limits. The pancreas and adrenal glands are
unremarkable.

The kidneys are unremarkable in appearance. There is no evidence of
hydronephrosis. No renal or ureteral stones are seen. No perinephric
stranding is appreciated.

Trace free fluid is noted within the pelvis. There is mild diffuse
wall thickening along a long segment of distal ileum, with mild
associated soft tissue inflammation, compatible with infectious or
inflammatory ileitis. The terminal ileum appears unremarkable,
suggesting against Crohn's disease. The more proximal small bowel is
unremarkable.

The stomach is within normal limits. No acute vascular abnormalities
are seen.

The appendix is normal in caliber and contains trace contrast,
without evidence of appendicitis. The colon is unremarkable in
appearance.

The bladder is decompressed and not well assessed. The uterus is
grossly unremarkable in appearance. No suspicious adnexal masses are
seen. The ovaries appear relatively symmetric. No inguinal
lymphadenopathy is seen.

No acute osseous abnormalities are identified.
IMPRESSION: Acute infectious or inflammatory ileitis noted, with mild diffuse
wall thickening along a long segment of distal ileum, and mild
associated soft tissue inflammation. Trace free fluid noted within
the pelvis. The terminal ileum appears unremarkable, suggesting
against Crohn's disease.

## 2016-03-17 ENCOUNTER — Ambulatory Visit: Payer: Managed Care, Other (non HMO) | Admitting: Obstetrics and Gynecology

## 2016-03-18 ENCOUNTER — Encounter: Payer: Self-pay | Admitting: Obstetrics and Gynecology

## 2016-03-18 NOTE — Progress Notes (Signed)
Patient did not show for GYN referral visit  Cornelia Copaharlie Chamara Dyck, Jr MD Attending Center for [facility](Faculty Practice)

## 2016-04-10 ENCOUNTER — Ambulatory Visit (INDEPENDENT_AMBULATORY_CARE_PROVIDER_SITE_OTHER): Payer: Self-pay | Admitting: Clinical

## 2016-04-10 ENCOUNTER — Other Ambulatory Visit (HOSPITAL_COMMUNITY)
Admission: RE | Admit: 2016-04-10 | Discharge: 2016-04-10 | Disposition: A | Discharge status: Home / Self Care | Payer: Self-pay | Source: Ambulatory Visit | Attending: Family Medicine | Admitting: Family Medicine

## 2016-04-10 ENCOUNTER — Ambulatory Visit (INDEPENDENT_AMBULATORY_CARE_PROVIDER_SITE_OTHER): Payer: Self-pay | Admitting: Family Medicine

## 2016-04-10 ENCOUNTER — Encounter: Payer: Self-pay | Admitting: Family Medicine

## 2016-04-10 DIAGNOSIS — N921 Excessive and frequent menstruation with irregular cycle: Secondary | ICD-10-CM

## 2016-04-10 DIAGNOSIS — F509 Eating disorder, unspecified: Secondary | ICD-10-CM

## 2016-04-10 DIAGNOSIS — N920 Excessive and frequent menstruation with regular cycle: Secondary | ICD-10-CM | POA: Insufficient documentation

## 2016-04-10 DIAGNOSIS — M25529 Pain in unspecified elbow: Secondary | ICD-10-CM

## 2016-04-10 LAB — TSH: TSH: 1.13 mIU/L

## 2016-04-10 LAB — CBC
HCT: 36.6 % (ref 35.0–45.0)
Hemoglobin: 11.7 g/dL (ref 11.7–15.5)
MCH: 24.2 pg — ABNORMAL LOW (ref 27.0–33.0)
MCHC: 32 g/dL (ref 32.0–36.0)
MCV: 75.6 fL — ABNORMAL LOW (ref 80.0–100.0)
MPV: 10.7 fL (ref 7.5–12.5)
Platelets: 380 10*3/uL (ref 140–400)
RBC: 4.84 MIL/uL (ref 3.80–5.10)
RDW: 22.2 % — ABNORMAL HIGH (ref 11.0–15.0)
WBC: 10.5 10*3/uL (ref 3.8–10.8)

## 2016-04-10 LAB — POCT PREGNANCY, URINE: Preg Test, Ur: NEGATIVE

## 2016-04-10 MED ORDER — MEGESTROL ACETATE 40 MG PO TABS: 40.0000 mg | ORAL_TABLET | Freq: Two times a day (BID) | ORAL | 3 refills | Status: DC

## 2016-04-10 MED ORDER — MELOXICAM 7.5 MG PO TABS: 7.5000 mg | ORAL_TABLET | Freq: Every day | ORAL | 0 refills | Status: DC

## 2016-04-10 NOTE — Patient Instructions (Signed)
Menorragia (Menorrhagia) Se llama menorragia a los perodos menstruales abundantes o que duran ms de lo habitual. En la menorragia, la prdida de sangre y los clicos en cada perodo pueden hacerle imposible seguir con sus actividades habituales. CAUSAS  En algunos casos, la causa de los perodos abundantes es desconocida, pero hay algunas afecciones que pueden causar menorragia. Las causas ms frecuentes son:  Un problema con la tiroides, que es la glndula productora de hormonas (hipotiroidismo).  Formaciones no cancerosas en el tero (plipos o fibromas).  Un desequilibrio entre las hormonas estrgeno y progesterona.  Uno de sus ovarios no libera vulos durante uno o ms meses.  Efectos secundarios por haberse colocado un dispositivo intrauterino (DIU).  Efectos secundarios por algunos medicamentos, como antiinflamatorios o anticoagulantes.  Trastornos hemorrgicos que impiden la correcta coagulacin. SIGNOS Y SNTOMAS  Durante un perodo normal, el sangrado dura entre 4 y 8 das. Los signos de que el perodo es muy abundante son:  De manera rutinaria tiene que cambiar el apsito o el tampn cada 1 o 2 horas debido a que est completamente empapado.  Elimina cogulos ms grandes de 1 pulgada (2,5 cm).  Tiene sangrado durante ms de 7 das.  Necesita usar apsitos y tampones al mismo tiempo porque pierde demasiada sangre.  Debe levantarse para cambiarse el apsito o el tampn durante la noche.  Tiene sntomas de anemia como cansancio, fatiga o falta de aire. DIAGNSTICO  El mdico le har un examen fsico y le har preguntas sobre sus sntomas y su historia menstrual. Podr indicarle otros estudios segn lo que encuentre durante el examen. Estos estudios pueden ser:  Anlisis de sangre. Los anlisis de sangre se usan para verificar si est embarazada o tiene cambios hormonales, un trastorno tiroideo o de sangrado, niveles bajos de hierro (anemia) u otros problemas.  Biopsia  de endometrio. El mdico tomar una muestra de tejido del interior del tero para que sea examinado con un microscopio.  Ecografa plvica. Este estudio utiliza ondas de sonido para tomar imgenes del tero, los ovarios y la vagina. Las imgenes pueden mostrar si tiene fibromas u otros crecimientos.  Histeroscopa. Para este estudio, el mdico usar un pequeo telescopio para mirar el interior del tero. Segn los resultados de los estudios iniciales, el mdico podr indicar ms estudios. TRATAMIENTO  Puede ser que no sea necesario un tratamiento mdico. Si lo necesita, el mdico primero podr recomendarle un tratamiento con uno o ms medicamentos. Si no se reduce el sangrado lo suficiente, el tratamiento quirrgico podra ser una opcin. El mejor tratamiento para usted depender de:   Si necesita evitar un embarazo.  Si desea tener hijos en el futuro.  La causa y la gravedad del sangrado.  Su opinin o preferencia personal. Algunos medicamentos para la menorragia son:  Mtodos anticonceptivos que contengan hormonas. Estos incluyen la pldora anticonceptiva, el parche en la piel, el anillo vaginal, las inyecciones que se aplican cada 3 meses, el DIU hormonal y el implante. Estos tratamientos reducen el sangrado durante el perodo menstrual.  Medicamentos que espesan la sangre y hacen ms lento el sangrado.  Medicamentos que reducen la inflamacin, como el ibuprofeno.  Medicamentos que contienen una hormona sinttica llamada progestina.  Medicamentos que hacen que los ovarios dejen de funcionar durante un breve lapso. Podra ser necesario un tratamiento quirrgico para la menorragia si los medicamentos no son eficaces. Las opciones de tratamiento incluyen:  Dilatacin y curetaje (D y C). En este procedimiento, el mdico abre (dilata) el cuello del tero   y luego raspa o succiona tejido del revestimiento interior del tero para reducir el sangrado menstrual.  Histeroscopa quirrgica. En  este procedimiento, se utiliza un pequeo tubo con una luz (histeroscopio) para observar la cavidad uterina y ayudar en la extirpacin quirrgica de un plipo que puede ser la causa de perodos abundantes.  Ablacin del endometrio. Por medio de diferentes tcnicas, el mdico destruye de manera permanente todo el revestimiento interno del tero (endometrio). Luego de la ablacin del endometrio, la mayora de las mujeres tienen escaso flujo menstrual, o no lo tienen. La ablacin del endometrio reduce la posibilidad de quedar embarazada.  Reseccin del endometrio. En este procedimiento quirrgico, se utiliza un asa de alambre electroquirrgica para extirpar el revestimiento interno del tero. Este procedimiento tambin reduce la posibilidad de quedar embarazada.  Histerectoma. La remocin quirrgica del tero y el cuello del tero es un procedimiento permanente que detiene los perodos menstruales. El embarazo no es posible luego de una histerectoma. Este procedimiento requiere de anestesia y hospitalizacin. INSTRUCCIONES PARA EL CUIDADO EN EL HOGAR   Tome solo medicamentos de venta libre o recetados, segn las indicaciones del mdico. Tome todos los medicamentos recetados exactamente como se le indic. No cambie ni reemplace los medicamentos sin consultarlo con el mdico.  Tome los comprimidos de hierro recetados, exactamente segn las indicaciones del mdico. Las hemorragias de larga duracin pueden traer como consecuencia una disminucin en los niveles de hierro. Los comprimidos de hierro ayudan a reponer el hierro que el organismo pierde luego de un sangrado abundante. El hierro puede causarle estreimiento. Si esto es un problema, aumente el consumo de salvado, frutas y fibra.  No tome aspirina ni medicamentos que contengan aspirina desde 1 semana antes ni durante el perodo menstrual. La aspirina puede hacer que la hemorragia empeore.  Si necesita cambiar el apsito o el tampn ms de una vez  cada 2horas, permanezca en cama y descanse todo lo posible hasta que la hemorragia se detenga.  Siga una dieta balanceada. Consuma alimentos ricos en hierro. Por ejemplo, vegetales de hoja verde, carne, hgado, huevos y panes y cereales de grano entero. No trate de perder peso hasta que la hemorragia anormal se detenga y los niveles de hierro en la sangre vuelvan a la normalidad. SOLICITE ATENCIN MDICA SI:   Empapa un tampn o un apsito cada 1 o 2 horas, y esto le ocurre cada vez que tiene el perodo.  Necesita usar apsitos y tampones al mismo tiempo porque pierde demasiada sangre.  Debe cambiarse el apsito o el tampn durante la noche.  Tiene un perodo que dura ms de 8 das.  Elimina cogulos de ms de 1 pulgada (2,5 cm).  Tiene perodos irregulares que ocurren ms o menos de una vez al mes.  Se siente mareada o se desmaya.  Se siente muy dbil o cansada.  Le falta el aire o siente que el corazn late muy rpido al hacer ejercicios.  Tiene nuseas y vmitos o diarrea mientras toma los medicamentos.  Tiene algn problema que puede estar relacionado con el medicamento que est tomando. SOLICITE ATENCIN MDICA DE INMEDIATO SI:   Empapa 4 o ms apsitos o tampones en 2 horas.  Tiene sangrado y est embarazada. ASEGRESE DE QUE:   Comprende estas instrucciones.  Controlar su afeccin.  Recibir ayuda de inmediato si no mejora o si empeora.   Esta informacin no tiene como fin reemplazar el consejo del mdico. Asegrese de hacerle al mdico cualquier pregunta que tenga.   Document Released:   03/29/2005 Document Revised: 06/24/2013 Elsevier Interactive Patient Education 2016 Elsevier Inc.  

## 2016-04-10 NOTE — Assessment & Plan Note (Signed)
Check labs and u/s and EMB--trial of Megace to stop bleeding.

## 2016-04-10 NOTE — BH Specialist Note (Signed)
Session Start time: 3:00   End Time: 3:45pm Total Time:  45 minutes Type of Service: Behavioral Health - Individual/Family Interpreter: Yes.     Interpreter Name & Language: Derek MoundRicardo [phone] Spanish # [facility]   SUBJECTIVE: Julie KrillMaria Herman is a 40 y.o. female  Pt. was referred by Tinnie Gensanya Pratt, MD for:  depression. Pt. reports the following symptoms/concerns: Pt states that her primary symptoms include sleeping about 3-4 hours/night on weekdays(being unable to go back to sleep), and overeating, including intense craving for dirt. Pt copes with lack of sleep by jogging 30 minutes/morning, and taking liquid vitamins from [place]. 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  Pt may benefit from psychoeducation and brief therapeutic interventions regarding coping with symptoms of anxiety and depression.     PLAN: 1. F/U with behavioral health clinician: At next medical visit, about 3 weeks 2. Behavioral Health meds: none 3. Behavioral recommendations:  -Read educational material regarding coping with symptoms of anxiety and depression -Remember importance of self-care for overall wellbeing -Consider writing down any possible changes that might be made to daily schedule, to increase quantity of  sleep 4. Referral: Brief Counseling/Psychotherapy and Psychoeducation   Dep[facility] screen [facility] 04/10/2016 06/02/2015  Decreased Interest 1 0  Down, Depressed, Hopeless 1 0  PHQ - 2 Score 2 0  Altered sleeping 3 -  Tired, decreased energy 1 -  Change in appetite 3 -  Feeling bad or failure about yourself  1 -  Trouble concentrating 2 -  Moving slowly or fidgety/restless 2 -  Suicidal thoughts 0 -  PHQ-9 Score 14 -   GAD 7 : Generalized Anxiety Score 04/10/2016  Nervous, Anxious, on Edge 1  Control/stop worrying 1  Worry too much - different things 2  Trouble relaxing 1  Restless 1  Easily annoyed or irritable 1  Afraid - awful might happen 1  Total GAD 7 Score 8     Woc-Behavioral Health Clinician  Behavioral Health Clinician  Warmhandoff:   Warm Hand Off Completed.         Depression screen [facility] 2[facility] 04/10/2016 06/02/2015  Decreased Interest 1 0  Down, Depressed, Hopeless 1 0  PHQ - 2 Score 2 0  Altered sleeping 3 -  Tired, decreased energy 1 -  Change in appetite 3 -  Feeling bad or failure about yourself  1 -  Trouble concentrating 2 -  Moving slowly or fidgety/restless 2 -  Suicidal thoughts 0 -  PHQ-9 Score 14 -   GAD 7 : Generalized Anxiety Score 04/10/2016  Nervous, Anxious, on Edge 1  Control/stop worrying 1  Worry too much - different things 2  Trouble relaxing 1  Restless 1  Easily annoyed or irritable 1  Afraid - awful might happen 1  Total GAD 7 Score 8

## 2016-04-10 NOTE — Progress Notes (Signed)
   Subjective:    Patient ID: Julie Herman is a 40 y.o. female presenting with Referral  on 04/10/2016  HPI: Here today with heavy bleeding x 1 year. Bleeding comes every other week, and bleeding is lasting 8-9 days. Previously cycles were normal came 1/month. She stopped her birth control pills almost one year ago. She has tried multiple treatments from her country. Natural pills that help with her cycle. Also, reports low iron levels. Feels like she has bloating after cycles and notes that she has pain prior to cycles which feels like child birth. Tries NSAIDS which helps. Has PICA and depression. Sleeps 3 hours/night.  Review of Systems  Constitutional: Negative for chills and fever.  Respiratory: Negative for shortness of breath.   Cardiovascular: Negative for chest pain.  Gastrointestinal: Negative for abdominal pain, nausea and vomiting.  Genitourinary: Negative for dysuria.  Skin: Negative for rash.      Objective:    BP 123/75   Pulse 86   Wt 160 lb (72.6 kg)   LMP 03/20/2016 (Exact Date) Comment: lasted 8 days  BMI 30.23 kg/m  Physical Exam  Constitutional: She is oriented to person, place, and time. She appears well-developed and well-nourished. No distress.  HENT:  Head: Normocephalic and atraumatic.  Eyes: No scleral icterus.  Neck: Neck supple.  Cardiovascular: Normal rate.   Pulmonary/Chest: Effort normal.  Abdominal: Soft.  Neurological: She is alert and oriented to person, place, and time.  Skin: Skin is warm and dry.  Psychiatric: She has a normal mood and affect.   Procedure: Patient given informed consent, signed copy in the chart, time out was performed. Appropriate time out taken. . The patient was placed in the lithotomy position and the cervix brought into view with sterile speculum.  Portio of cervix cleansed x 2 with betadine swabs.  A tenaculum was placed in the anterior lip of the cervix.  The uterus was sounded for depth of 11. A pipelle was  introduced to into the uterus, suction created,  and an endometrial sample was obtained. All equipment was removed and accounted for.  The patient tolerated the procedure well.      Assessment & Plan:   Problem List Items Addressed This Visit      Unprioritized   Menorrhagia    Check labs and u/s and EMB--trial of Megace to stop bleeding.      Relevant Medications   meloxicam (MOBIC) 7.5 MG tablet   megestrol (MEGACE) 40 MG tablet   Other Relevant Orders   CBC   TSH   US Pelvis Complete   US Transvaginal Non-OB   Surgical pathology    Other Visit Diagnoses    Elbow pain, unspecified laterality          Total face-to-face time with patient:  minutes. Over 50% of encounter was spent on counseling and coordination of care. Return in about 3 months (around 07/11/2016).  Reva Boresanya S Laketia Vicknair 04/10/2016 2:03 PM

## 2016-05-08 ENCOUNTER — Ambulatory Visit (INDEPENDENT_AMBULATORY_CARE_PROVIDER_SITE_OTHER): Payer: Self-pay | Admitting: Family Medicine

## 2016-05-08 ENCOUNTER — Encounter: Payer: Self-pay | Admitting: Family Medicine

## 2016-05-08 VITALS — BP 126/84 | HR 66 | Ht 61.0 in | Wt 163.0 lb

## 2016-05-08 DIAGNOSIS — N921 Excessive and frequent menstruation with irregular cycle: Secondary | ICD-10-CM

## 2016-05-08 MED ORDER — MEGESTROL ACETATE 40 MG PO TABS: 40.0000 mg | ORAL_TABLET | Freq: Every day | ORAL | 3 refills | Status: DC

## 2016-05-08 MED ORDER — MELOXICAM 7.5 MG PO TABS: 7.5000 mg | ORAL_TABLET | Freq: Every day | ORAL | 2 refills | Status: DC

## 2016-05-08 NOTE — Progress Notes (Signed)
   Subjective:    Patient ID: Julie Herman is a 40 y.o. female presenting with Follow-up  on 05/08/2016  HPI: Patient reports that she is taking the medicine and this has stopped her periods. She has dysmenorrhea with cycles, but no cycles right now.  Review of Systems  Constitutional: Negative for chills and fever.  Respiratory: Negative for shortness of breath.   Cardiovascular: Negative for chest pain.  Gastrointestinal: Negative for abdominal pain, nausea and vomiting.  Genitourinary: Negative for dysuria.  Skin: Negative for rash.      Objective:    BP 126/84   Pulse 66   Ht 5\' 1"  (1.549 m)   Wt 163 lb (73.9 kg)   LMP 03/20/2016 (Exact Date) Comment: lasted 8 days  BMI 30.80 kg/m  Physical Exam  Constitutional: She is oriented to person, place, and time. She appears well-developed and well-nourished. No distress.  HENT:  Head: Normocephalic and atraumatic.  Eyes: No scleral icterus.  Neck: Neck supple.  Cardiovascular: Normal rate.   Pulmonary/Chest: Effort normal.  Abdominal: Soft.  Neurological: She is alert and oriented to person, place, and time.  Skin: Skin is warm and dry.  Psychiatric: She has a normal mood and affect.        Assessment & Plan:   Problem List Items Addressed This Visit      Unprioritized   Menorrhagia - Primary    Much improved with Megace. EMB is negative, CBC and TSH are also normal. May continue her Megace--decrease it to 1x/day then eventually may stop and see how periods are. Mobic for dysmenorrhea. May use megace with bleeding only.consider progesterone containing IUD if needed.      Relevant Medications   meloxicam (MOBIC) 7.5 MG tablet   megestrol (MEGACE) 40 MG tablet      Total face-to-face time with patient: 15 minutes. Over 50% of encounter was spent on counseling and coordination of care. Return in about 3 months (around 08/08/2016).  Reva Boresanya S Kamyiah Colantonio 05/08/2016 11:22 AM

## 2016-05-08 NOTE — Assessment & Plan Note (Signed)
Much improved with Megace. EMB is negative, CBC and TSH are also normal. May continue her Megace--decrease it to 1x/day then eventually may stop and see how periods are. Mobic for dysmenorrhea. May use megace with bleeding only.consider progesterone containing IUD if needed.

## 2016-05-08 NOTE — Patient Instructions (Signed)
Menorragia (Menorrhagia) Se llama menorragia a los perodos menstruales abundantes o que duran ms de lo habitual. En la menorragia, la prdida de sangre y los clicos en cada perodo pueden hacerle imposible seguir con sus actividades habituales. CAUSAS  En algunos casos, la causa de los perodos abundantes es desconocida, pero hay algunas afecciones que pueden causar menorragia. Las causas ms frecuentes son:  Un problema con la tiroides, que es la glndula productora de hormonas (hipotiroidismo).  Formaciones no cancerosas en el tero (plipos o fibromas).  Un desequilibrio entre las hormonas estrgeno y progesterona.  Uno de sus ovarios no libera vulos durante uno o ms meses.  Efectos secundarios por haberse colocado un dispositivo intrauterino (DIU).  Efectos secundarios por algunos medicamentos, como antiinflamatorios o anticoagulantes.  Trastornos hemorrgicos que impiden la correcta coagulacin. SIGNOS Y SNTOMAS  Durante un perodo normal, el sangrado dura entre 4 y 8 das. Los signos de que el perodo es muy abundante son:  De manera rutinaria tiene que cambiar el apsito o el tampn cada 1 o 2 horas debido a que est completamente empapado.  Elimina cogulos ms grandes de 1 pulgada (2,5 cm).  Tiene sangrado durante ms de 7 das.  Necesita usar apsitos y tampones al mismo tiempo porque pierde demasiada sangre.  Debe levantarse para cambiarse el apsito o el tampn durante la noche.  Tiene sntomas de anemia como cansancio, fatiga o falta de aire. DIAGNSTICO  El mdico le har un examen fsico y le har preguntas sobre sus sntomas y su historia menstrual. Podr indicarle otros estudios segn lo que encuentre durante el examen. Estos estudios pueden ser:  Anlisis de sangre. Los anlisis de sangre se usan para verificar si est embarazada o tiene cambios hormonales, un trastorno tiroideo o de sangrado, niveles bajos de hierro (anemia) u otros problemas.  Biopsia  de endometrio. El mdico tomar una muestra de tejido del interior del tero para que sea examinado con un microscopio.  Ecografa plvica. Este estudio utiliza ondas de sonido para tomar imgenes del tero, los ovarios y la vagina. Las imgenes pueden mostrar si tiene fibromas u otros crecimientos.  Histeroscopa. Para este estudio, el mdico usar un pequeo telescopio para mirar el interior del tero. Segn los resultados de los estudios iniciales, el mdico podr indicar ms estudios. TRATAMIENTO  Puede ser que no sea necesario un tratamiento mdico. Si lo necesita, el mdico primero podr recomendarle un tratamiento con uno o ms medicamentos. Si no se reduce el sangrado lo suficiente, el tratamiento quirrgico podra ser una opcin. El mejor tratamiento para usted depender de:   Si necesita evitar un embarazo.  Si desea tener hijos en el futuro.  La causa y la gravedad del sangrado.  Su opinin o preferencia personal. Algunos medicamentos para la menorragia son:  Mtodos anticonceptivos que contengan hormonas. Estos incluyen la pldora anticonceptiva, el parche en la piel, el anillo vaginal, las inyecciones que se aplican cada 3 meses, el DIU hormonal y el implante. Estos tratamientos reducen el sangrado durante el perodo menstrual.  Medicamentos que espesan la sangre y hacen ms lento el sangrado.  Medicamentos que reducen la inflamacin, como el ibuprofeno.  Medicamentos que contienen una hormona sinttica llamada progestina.  Medicamentos que hacen que los ovarios dejen de funcionar durante un breve lapso. Podra ser necesario un tratamiento quirrgico para la menorragia si los medicamentos no son eficaces. Las opciones de tratamiento incluyen:  Dilatacin y curetaje (D y C). En este procedimiento, el mdico abre (dilata) el cuello del tero   y luego raspa o succiona tejido del revestimiento interior del tero para reducir el sangrado menstrual.  Histeroscopa quirrgica. En  este procedimiento, se utiliza un pequeo tubo con una luz (histeroscopio) para observar la cavidad uterina y ayudar en la extirpacin quirrgica de un plipo que puede ser la causa de perodos abundantes.  Ablacin del endometrio. Por medio de diferentes tcnicas, el mdico destruye de manera permanente todo el revestimiento interno del tero (endometrio). Luego de la ablacin del endometrio, la mayora de las mujeres tienen escaso flujo menstrual, o no lo tienen. La ablacin del endometrio reduce la posibilidad de quedar embarazada.  Reseccin del endometrio. En este procedimiento quirrgico, se utiliza un asa de alambre electroquirrgica para extirpar el revestimiento interno del tero. Este procedimiento tambin reduce la posibilidad de quedar embarazada.  Histerectoma. La remocin quirrgica del tero y el cuello del tero es un procedimiento permanente que detiene los perodos menstruales. El embarazo no es posible luego de una histerectoma. Este procedimiento requiere de anestesia y hospitalizacin. INSTRUCCIONES PARA EL CUIDADO EN EL HOGAR   Tome solo medicamentos de venta libre o recetados, segn las indicaciones del mdico. Tome todos los medicamentos recetados exactamente como se le indic. No cambie ni reemplace los medicamentos sin consultarlo con el mdico.  Tome los comprimidos de hierro recetados, exactamente segn las indicaciones del mdico. Las hemorragias de larga duracin pueden traer como consecuencia una disminucin en los niveles de hierro. Los comprimidos de hierro ayudan a reponer el hierro que el organismo pierde luego de un sangrado abundante. El hierro puede causarle estreimiento. Si esto es un problema, aumente el consumo de salvado, frutas y fibra.  No tome aspirina ni medicamentos que contengan aspirina desde 1 semana antes ni durante el perodo menstrual. La aspirina puede hacer que la hemorragia empeore.  Si necesita cambiar el apsito o el tampn ms de una vez  cada 2horas, permanezca en cama y descanse todo lo posible hasta que la hemorragia se detenga.  Siga una dieta balanceada. Consuma alimentos ricos en hierro. Por ejemplo, vegetales de hoja verde, carne, hgado, huevos y panes y cereales de grano entero. No trate de perder peso hasta que la hemorragia anormal se detenga y los niveles de hierro en la sangre vuelvan a la normalidad. SOLICITE ATENCIN MDICA SI:   Empapa un tampn o un apsito cada 1 o 2 horas, y esto le ocurre cada vez que tiene el perodo.  Necesita usar apsitos y tampones al mismo tiempo porque pierde demasiada sangre.  Debe cambiarse el apsito o el tampn durante la noche.  Tiene un perodo que dura ms de 8 das.  Elimina cogulos de ms de 1 pulgada (2,5 cm).  Tiene perodos irregulares que ocurren ms o menos de una vez al mes.  Se siente mareada o se desmaya.  Se siente muy dbil o cansada.  Le falta el aire o siente que el corazn late muy rpido al hacer ejercicios.  Tiene nuseas y vmitos o diarrea mientras toma los medicamentos.  Tiene algn problema que puede estar relacionado con el medicamento que est tomando. SOLICITE ATENCIN MDICA DE INMEDIATO SI:   Empapa 4 o ms apsitos o tampones en 2 horas.  Tiene sangrado y est embarazada. ASEGRESE DE QUE:   Comprende estas instrucciones.  Controlar su afeccin.  Recibir ayuda de inmediato si no mejora o si empeora.   Esta informacin no tiene como fin reemplazar el consejo del mdico. Asegrese de hacerle al mdico cualquier pregunta que tenga.   Document Released:   03/29/2005 Document Revised: 06/24/2013 Elsevier Interactive Patient Education 2016 Elsevier Inc.  

## 2016-05-11 ENCOUNTER — Ambulatory Visit (INDEPENDENT_AMBULATORY_CARE_PROVIDER_SITE_OTHER): Payer: Self-pay | Admitting: Clinical

## 2016-05-11 DIAGNOSIS — Z6379 Other stressful life events affecting family and household: Secondary | ICD-10-CM

## 2016-05-11 NOTE — BH Specialist Note (Signed)
Session Start time: 11:10   End Time: 11:55 Total Time:  45 Type of Service: Behavioral Health - Individual/Family Interpreter: Yes.     Interpreter Name & Language: [place]/Spanish # [facility] July [facility]-June 2018: 2nd   SUBJECTIVE: Julie Herman is a 40 y.o. female  Pt. was referred by Dr Shawnie PonsPratt for:  Eating disorder, depression and anxiety Pt. reports the following symptoms/concerns: Pt states that she has been drinking garlic and lemon water every morning for the past 4 weeks, which has helped her to feel better, and has increased her sleep most nights to about 7.5 hours/night. Pt not feeling depressed, and not having cravings for dirt. She is currently most stressed over not hearing from a family member picked by immigration. Duration of problem:  One month decrease Severity: mild Previous treatment: none   OBJECTIVE: Mood: Appropriate & Affect: Appropriate Risk of harm to self or others: No known risk of harm to self or others Assessments administered: PHQ9: 1/ GAD7: 1  LIFE CONTEXT:  Family & Social: 4 children, ages 289-25 School/ Work: Unknown  Self-Care: Still jogging daily at [employer]9am, sleeping increased from 3 hours to 7.5 hours nightly, no cravings Life changes: Cousin taken by immigration agents What is important to pt/family (values): Family   GOALS ADDRESSED:  -Maintaining decrease in symptoms of anxiety and depression -Maintaining increased sleep for self-care and overall wellbeing  INTERVENTIONS: Strength-based and Supportive   ASSESSMENT:  Pt currently experiencing Stressful life events affecting family and household.  Pt may benefit from supportive counseling and community resources to cope with family stressors.      PLAN: 1. F/U with behavioral health clinician: as needed 2. Behavioral Health meds: none 3. Behavioral recommendations:  -Continue daily jogging -Continue daily garlic and lemon drinks, as long as they continue to be helpful with overall sleep  and wellbeing - Consider rinsing/brishing mouth after drinking lemon water to prevent tooth erosion (and discuss with dentist, as needed) -Consider cutting out any caffeine after 4pm, and limiting any daytime naps to no more than 30 minutes -[facility] to [facility] during walk-in hours this week, for additional services available to household and extended family regarding immigration legal issues  4. Referral: Brief Counseling/Psychotherapy and [facility] 5. From scale of 1-10, how likely are you to follow plan: 10   Woc-Behavioral Health Clinician  Behavioral Health Clinician  Warmhandoff: no  [facility] 05/11/2016 05/08/2016 04/10/2016 06/02/2015  Decreased Interest 0 0 1 0  Down, Depressed, Hopeless 0 0 1 0  PHQ - 2 Score 0 0 2 0  Altered sleeping 0 0 3 -  Tired, decreased energy 1 2 1  -  Change in appetite 0 0 3 -  Feeling bad or failure about yourself  0 0 1 -  Trouble concentrating 0 0 2 -  Moving slowly or fidgety/restless 0 0 2 -  Suicidal thoughts 0 0 0 -  PHQ-9 Score 1 2 14  -   GAD 7 : Generalized Anxiety Score 05/11/2016 05/08/2016 04/10/2016  Nervous, Anxious, on Edge 0 0 1  Control/stop worrying 0 0 1  Worry too much - different things 1 0 2  Trouble relaxing 0 0 1  Restless 0 0 1  Easily annoyed or irritable 0 0 1  Afraid - awful might happen 0 0 1  Total GAD 7 Score 1 0 8

## 2017-06-07 ENCOUNTER — Other Ambulatory Visit: Payer: Self-pay | Admitting: Obstetrics & Gynecology

## 2017-06-07 DIAGNOSIS — Z1231 Encounter for screening mammogram for malignant neoplasm of breast: Secondary | ICD-10-CM

## 2017-07-16 ENCOUNTER — Encounter: Payer: Self-pay | Admitting: Radiology

## 2017-07-16 ENCOUNTER — Ambulatory Visit
Admission: RE | Admit: 2017-07-16 | Discharge: 2017-07-16 | Disposition: A | Discharge status: Home / Self Care | Payer: Self-pay | Source: Ambulatory Visit | Attending: Obstetrics & Gynecology | Admitting: Obstetrics & Gynecology

## 2017-07-16 DIAGNOSIS — Z1231 Encounter for screening mammogram for malignant neoplasm of breast: Secondary | ICD-10-CM

## 2020-03-02 ENCOUNTER — Encounter (INDEPENDENT_AMBULATORY_CARE_PROVIDER_SITE_OTHER): Payer: Self-pay

## 2020-03-04 ENCOUNTER — Other Ambulatory Visit: Payer: Self-pay

## 2020-03-04 ENCOUNTER — Ambulatory Visit: Payer: Self-pay | Attending: Family Medicine | Admitting: Family Medicine

## 2020-03-04 ENCOUNTER — Encounter: Payer: Self-pay | Admitting: Family Medicine

## 2020-03-04 VITALS — BP 117/94 | HR 87 | Temp 97.7°F | Ht 61.5 in | Wt 156.8 lb

## 2020-03-04 DIAGNOSIS — K219 Gastro-esophageal reflux disease without esophagitis: Secondary | ICD-10-CM

## 2020-03-04 DIAGNOSIS — M549 Dorsalgia, unspecified: Secondary | ICD-10-CM

## 2020-03-04 DIAGNOSIS — Z758 Other problems related to medical facilities and other health care: Secondary | ICD-10-CM

## 2020-03-04 DIAGNOSIS — Z789 Other specified health status: Secondary | ICD-10-CM

## 2020-03-04 DIAGNOSIS — Z603 Acculturation difficulty: Secondary | ICD-10-CM

## 2020-03-04 DIAGNOSIS — J039 Acute tonsillitis, unspecified: Secondary | ICD-10-CM

## 2020-03-04 DIAGNOSIS — R05 Cough: Secondary | ICD-10-CM

## 2020-03-04 DIAGNOSIS — R058 Other specified cough: Secondary | ICD-10-CM

## 2020-03-04 DIAGNOSIS — J069 Acute upper respiratory infection, unspecified: Secondary | ICD-10-CM

## 2020-03-04 MED ORDER — AMOXICILLIN-POT CLAVULANATE 500-125 MG PO TABS: ORAL_TABLET | ORAL | 0 refills | Status: DC

## 2020-03-04 MED ORDER — FAMOTIDINE 20 MG PO TABS: 20.0000 mg | ORAL_TABLET | Freq: Two times a day (BID) | ORAL | 3 refills | Status: DC

## 2020-03-04 MED ORDER — CETIRIZINE HCL 10 MG PO TABS: ORAL_TABLET | ORAL | 2 refills | Status: DC

## 2020-03-04 MED FILL — FAMOTIDINE 20 MG TABS: 20 | 30 days supply | Qty: 60 | Fill #0

## 2020-03-04 MED FILL — AMOX-CLAV 500-125 MG TABLET: 500-125 | 10 days supply | Qty: 20 | Fill #0

## 2020-03-04 NOTE — Progress Notes (Signed)
New Patient Office Visit  Subjective:  Patient ID: Julie Herman, female    DOB: [birth year]  Age: 44 y.o. MRN: [id number]   Due to the language barrier, Stratus video interpretation system used to today's visit  CC:  Chief Complaint  Patient presents with  . Establish Care    Pt. is here to establish care. Pt. stated she been having cough for a month, comes and go.     HPI Julie Herman, 44 yo female new to the practice, who presents to establish care. She reports that she has had onset of a recurrent cough since July 25 of this year. She also reports recurrent episodes of sore throat, frontal and right frontal side of scalp headache. Occasional right sided facial pressure along with nasal congestion and occasional right ear discomfort especially when she is having sore throat and cough. She has tried over-the-counter TheraFlu as well as herbal teas to help with her symptoms. Cough is occasionally worse at night. Cough was initially productive of clear phlegm but is now mostly dry. She does have current sore throat. She did have a fever approximately 2 weeks ago. She has felt as if there are swollen nodules beneath her chin/upper neck that  started about 2 weeks ago. She has had some nausea and sensation of acid reflux.        She has noticed that since she had onset of cough back in July, that she will occasionally lose her voice completely or become very hoarse. She has also has a recurrent left mid back pain that is worse with coughing. She denies any urinary frequency, urgency or dysuria. She denies any lower abdominal pain.  Past Medical History:  Diagnosis Date  . Neuromuscular disorder [facility])     Past Surgical History:  Procedure Laterality Date  . NO PAST SURGERIES      Family History  Problem Relation Age of Onset  . Heart disease Mother   . Diabetes Brother   . Diabetes Maternal Aunt   . Diabetes Paternal Aunt     Social History    Socioeconomic History  . Marital status: Single    Spouse name: Not on file  . Number of children: Not on file  . Years of education: Not on file  . Highest education level: Not on file  Occupational History  . Not on file  Tobacco Use  . Smoking status: Never Smoker  . Smokeless tobacco: Never Used  Substance and Sexual Activity  . Alcohol use: Yes    Alcohol/week: 0.0 standard drinks  . Drug use: No  . Sexual activity: Yes    Birth control/protection: None  Other Topics Concern  . Not on file  Social History Narrative  . Not on file   Social Determinants of Health   Financial Resource Strain:   . Difficulty of Paying Living Expenses: Not on file  Food Insecurity:   . Worried About Programme researcher, broadcasting/film/video in the Last Year: Not on file  . Ran Out of Food in the Last Year: Not on file  Transportation Needs:   . Lack of Transportation (Medical): Not on file  . Lack of Transportation (Non-Medical): Not on file  Physical Activity:   . Days of Exercise per Week: Not on file  . Minutes of Exercise per Session: Not on file  Stress:   . Feeling of Stress : Not on file  Social Connections:   . Frequency of Communication with Friends and  Family: Not on file  . Frequency of Social Gatherings with Friends and Family: Not on file  . Attends Religious Services: Not on file  . Active Member of Clubs or Organizations: Not on file  . Attends Banker Meetings: Not on file  . Marital Status: Not on file  Intimate Partner Violence:   . Fear of Current or Ex-Partner: Not on file  . Emotionally Abused: Not on file  . Physically Abused: Not on file  . Sexually Abused: Not on file    ROS Review of Systems  Constitutional: Positive for chills, fatigue and fever.  HENT: Positive for congestion, postnasal drip, rhinorrhea, sinus pressure, sore throat and trouble swallowing (Due to sore throat). Negative for nosebleeds.   Respiratory: Positive for cough and shortness of breath  (Mild shortness of breath with recurrent cough). Negative for wheezing.   Cardiovascular: Negative for chest pain and palpitations.  Gastrointestinal: Positive for abdominal pain (Occasional discomfort in mid upper abdomen) and nausea. Negative for blood in stool, constipation and diarrhea.  Endocrine: Negative for polydipsia, polyphagia and polyuria.  Genitourinary: Negative for dysuria and frequency.  Musculoskeletal: Positive for back pain.  Allergic/Immunologic: Negative for food allergies and immunocompromised state.  Neurological: Positive for headaches. Negative for dizziness.  Hematological: Positive for adenopathy (Under chin/upper neck). Does not bruise/bleed easily.    Objective:   Today's Vitals: BP (!) 117/94 (BP Location: Left Arm, Patient Position: Sitting, Cuff Size: Normal)   Pulse 87   Temp 97.7 F (36.5 C) (Temporal)   Ht 5' 1.5" (1.562 m)   Wt 156 lb 12.8 oz (71.1 kg)   LMP 02/26/2020 (Exact Date)   SpO2 97%   BMI 29.15 kg/m   Physical Exam Vitals and nursing note reviewed.  Constitutional:      General: She is not in acute distress.    Appearance: Normal appearance.  HENT:     Ears:     Comments: TMs dull bilaterally with subacute right fluid level behind TM    Nose: Congestion and rhinorrhea present.     Comments: Edema and mild erythema of the nasal turbinates with mild clear nasal discharge    Mouth/Throat:     Pharynx: Oropharyngeal exudate and posterior oropharyngeal erythema present.     Comments: Patient with edematous/erythematous tonsils with appearance of pustules left greater than right Eyes:     Extraocular Movements: Extraocular movements intact.     Pupils: Pupils are equal, round, and reactive to light.  Neck:     Comments: Bilateral tender submandibular lymphadenopathy Cardiovascular:     Rate and Rhythm: Normal rate and regular rhythm.  Pulmonary:     Effort: Pulmonary effort is normal.     Breath sounds: Normal breath sounds. No  wheezing.  Abdominal:     Palpations: Abdomen is soft.     Tenderness: There is no abdominal tenderness. There is left CVA tenderness. There is no guarding or rebound.     Comments: Complaint of mild epigastric discomfort but not reproducible on exam  Musculoskeletal:     Cervical back: Neck supple. Tenderness present.     Right lower leg: No edema.     Left lower leg: No edema.  Lymphadenopathy:     Cervical: Cervical adenopathy present.  Skin:    General: Skin is warm and dry.     Findings: No rash.  Neurological:     General: No focal deficit present.     Mental Status: She is alert and oriented to person,  place, and time.  Psychiatric:        Mood and Affect: Mood normal.        Behavior: Behavior normal.     Assessment & Plan:  1. Tonsillitis Patient appears to have tonsillitis on examination and will be placed on Augmentin 500 mg twice daily x10 days.  She may continue over-the-counter pain medications as needed for throat pain as well as gargling with warm salt water.  Call or return sooner if worsening of throat pain, increased difficulty swallowing or any concerns. - amoxicillin-clavulanate (AUGMENTIN) 500-125 MG tablet; One pill twice per day take after eating  Dispense: 20 tablet; Refill: 0  2. Recurrent cough Lungs are clear on exam and she denies any issues with prior asthma.  Suspect that cough may be related to her recurrent postnasal drainage which is likely related to URI.  Prescription provided for Augmentin which would treat community acquired pneumonia if present but primarily prescribed for patient's tonsillitis seen on exam.  Prescription for Zyrtec 10 mg to take in the evenings to help with postnasal drainage and this may also help decrease patient's cough.  If cough continues, chest x-ray will be ordered at patient's follow-up visit in 2 weeks. - amoxicillin-clavulanate (AUGMENTIN) 500-125 MG tablet; One pill twice per day take after eating  Dispense: 20 tablet;  Refill: 0 - cetirizine (ZYRTEC) 10 MG tablet; One pill by mouth in the evening to help nasal congestion  Dispense: 30 tablet; Refill: 2  3. Gastroesophageal reflux disease, unspecified whether esophagitis present She reports occasional nausea and sensation of backwash of fluids into the throat consistent with acid reflux.  Prescription provided for Pepcid 20 mg twice daily.  Patient will be reevaluated at follow-up visit.  Reflux could potentially also contribute to patient's current issues with recurrent cough.  -famotidine (PEPCID) 20 MG tablet; Take 1 tablet (20 mg total) by mouth 2 (two) times daily. For acid reflux  Dispense: 60 tablet; Refill: 3  4. URI with cough and congestion Patient with nasal congestion and postnasal drainage which are likely contributing to her recurrent cough.  Prescription provided for Zyrtec 10 mg at bedtime to help with nasal congestion/postnasal drainage.  5. Mid back pain on left side Patient with left-sided mid back pain which she reports is worse with coughing.  She denies any urinary symptoms therefore discussed with patient that symptoms may be musculoskeletal and she can continue use of over-the-counter pain medications as needed.  6. Language barrier Stratus video interpretation system used at today's visit the help of language barrier  7. Need for follow-up by social worker Patient completed PHQ-9 screening at today's visit which was abnormal. Referral placed for patient to be followed up by social worker to see if counseling needed or if symptoms are mostly due to her current prolonged illness. - Ambulatory referral to Social Work   Outpatient Encounter Medications as of 03/04/2020  Medication Sig  . IBUPROFEN PO Take by mouth.  . megestrol (MEGACE) 40 MG tablet Take 1 tablet (40 mg total) by mouth daily. (Patient not taking: Reported on 03/04/2020)  . meloxicam (MOBIC) 7.5 MG tablet Take 1 tablet (7.5 mg total) by mouth daily. (Patient not taking:  Reported on 03/04/2020)   No facility-administered encounter medications on file as of 03/04/2020.    Follow-up: Return in about 2 weeks (around 03/18/2020) for cough, sore throat, reflux.   Cain Saupe, MD

## 2020-03-12 ENCOUNTER — Other Ambulatory Visit: Payer: Self-pay

## 2020-03-12 ENCOUNTER — Ambulatory Visit: Payer: Self-pay

## 2020-03-31 ENCOUNTER — Other Ambulatory Visit: Payer: Self-pay

## 2020-03-31 ENCOUNTER — Ambulatory Visit: Payer: Self-pay | Attending: Family Medicine | Admitting: Licensed Clinical Social Worker

## 2020-03-31 ENCOUNTER — Other Ambulatory Visit: Payer: Self-pay | Admitting: Family Medicine

## 2020-03-31 ENCOUNTER — Ambulatory Visit: Payer: Self-pay | Attending: Family Medicine | Admitting: Family Medicine

## 2020-03-31 ENCOUNTER — Encounter: Payer: Self-pay | Admitting: Family Medicine

## 2020-03-31 VITALS — BP 116/78 | HR 80 | Temp 98.1°F | Resp 16 | Ht 61.0 in | Wt 156.4 lb

## 2020-03-31 DIAGNOSIS — K219 Gastro-esophageal reflux disease without esophagitis: Secondary | ICD-10-CM

## 2020-03-31 DIAGNOSIS — J029 Acute pharyngitis, unspecified: Secondary | ICD-10-CM

## 2020-03-31 DIAGNOSIS — G8929 Other chronic pain: Secondary | ICD-10-CM

## 2020-03-31 DIAGNOSIS — Z23 Encounter for immunization: Secondary | ICD-10-CM

## 2020-03-31 DIAGNOSIS — R05 Cough: Secondary | ICD-10-CM

## 2020-03-31 DIAGNOSIS — Z758 Other problems related to medical facilities and other health care: Secondary | ICD-10-CM

## 2020-03-31 DIAGNOSIS — F411 Generalized anxiety disorder: Secondary | ICD-10-CM

## 2020-03-31 DIAGNOSIS — F4323 Adjustment disorder with mixed anxiety and depressed mood: Secondary | ICD-10-CM

## 2020-03-31 DIAGNOSIS — R058 Other specified cough: Secondary | ICD-10-CM

## 2020-03-31 DIAGNOSIS — Z789 Other specified health status: Secondary | ICD-10-CM

## 2020-03-31 DIAGNOSIS — Z603 Acculturation difficulty: Secondary | ICD-10-CM

## 2020-03-31 DIAGNOSIS — J329 Chronic sinusitis, unspecified: Secondary | ICD-10-CM

## 2020-03-31 MED ORDER — AZITHROMYCIN 250 MG PO TABS: ORAL_TABLET | ORAL | 0 refills | Status: DC

## 2020-03-31 MED ORDER — DULOXETINE HCL 30 MG PO CPEP: 30.0000 mg | ORAL_CAPSULE | Freq: Two times a day (BID) | ORAL | 1 refills | Status: DC

## 2020-03-31 MED ORDER — OMEPRAZOLE 20 MG PO CPDR: 20.0000 mg | DELAYED_RELEASE_CAPSULE | Freq: Two times a day (BID) | ORAL | 3 refills | Status: DC

## 2020-03-31 MED ORDER — LORATADINE 10 MG PO TABS: 10.0000 mg | ORAL_TABLET | Freq: Every day | ORAL | 11 refills | Status: DC

## 2020-03-31 MED FILL — LORATADINE 10 MG TABLET: 10 | 30 days supply | Qty: 30 | Fill #0

## 2020-03-31 MED FILL — DULoxetine HCL 30 MG CPEP: 30 | 30 days supply | Qty: 60 | Fill #0

## 2020-03-31 MED FILL — ?AZITHROMYCIN 250MG TABLETS: 250 | 5 days supply | Qty: 6 | Fill #0

## 2020-03-31 MED FILL — ?OMEPRAZOLE 20 MG CPDR: 20 | 30 days supply | Qty: 60 | Fill #0

## 2020-03-31 NOTE — Patient Instructions (Signed)
Faringitis Pharyngitis  La faringitis es un dolor de garganta (faringe). Se produce cuando la garganta presenta enrojecimiento, dolor e hinchazn. La mayora de las veces, esta afeccin mejora por s sola. En algunos casos, podra requerir la administracin de medicamentos. Siga estas indicaciones en su casa:  Tome los medicamentos de venta libre y los recetados solamente como se lo haya indicado el mdico. ? Si le recetaron un antibitico, tmelo como se lo haya indicado el mdico. No deje de tomar los antibiticos aunque comience a sentirse mejor. ? No administre aspirina a los nios. La aspirina se ha vinculado al sndrome de [place].  Beba suficiente agua y lquido para mantener el pis (orina) de color claro o amarillo plido.  Descanse lo suficiente.  Enjuguese la boca (haga grgaras) con una mezcla de agua con sal 3o 4veces al da o segn sea necesario. Para preparar la mezcla de agua con sal, disuelva por completo de media a 1cucharadita de sal en 1taza de agua tibia.  Si su mdico lo aprueba, puede usar pastillas o aerosoles para aliviar el dolor de garganta. Comunquese con un mdico si:  Tiene bultos grandes y dolorosos en el cuello.  Tiene una erupcin cutnea.  Cuando tose elimina una expectoracin verde, amarillo amarronado o con sangre. Solicite ayuda de inmediato si:  Presenta rigidez en el cuello.  Babea o no puede tragar lquidos.  No puede beber o tomar medicamentos sin vomitar.  Siente un dolor intenso que no se alivia con medicamentos.  Tiene problemas para respirar que no se deben a la congestin nasal.  Tiene dolor e hinchazn en las rodillas, los tobillos, las muecas o los codos que antes no tena. Resumen  La faringitis es un dolor de garganta (faringe). Se produce cuando la garganta presenta enrojecimiento, dolor e hinchazn.  Si le recetaron un antibitico, tmelo como se lo haya indicado el mdico. No deje de tomar los antibiticos aunque  comience a sentirse mejor.  La mayora de las veces, la faringitis mejora por s sola. A veces, puede requerir la administracin de medicamentos. Esta informacin no tiene como fin reemplazar el consejo del mdico. Asegrese de hacerle al mdico cualquier pregunta que tenga. Document Revised: 03/14/2017 Document Reviewed: 03/14/2017 Elsevier Patient Education  2020 Elsevier Inc.  

## 2020-03-31 NOTE — Progress Notes (Signed)
Established Patient Office Visit  Subjective:  Patient ID: Julie Herman, female    DOB: 02-14-1976  Age: 44 y.o. MRN: [id number]   Due to language barrier, Stratus/[employer] video interpretation system used at today's visit  CC:  Chief Complaint  Patient presents with  . Follow-up    cough/sore throat/acid reflux    HPI Julie Herman presents for follow-up after her new patient visit on 03/03/2020 at which time patient reported a recurrent cough since late July as well as recurrent sore throat, frontal headache and recurrent left mid back pain that was worse with coughing.  At her last visit, she was also referred for social work follow-up due to abnormal PHQ-9/depression screening.          She reports that since her last visit, her throat feels much better.  She did complete the prescribed antibiotics after her last visit.  She still has some issues with sensation of bad tasting fluid coming up into her throat/acid reflux symptoms as she states she had to stop the Pepcid after only a few days as she felt that this medication caused her to be constipated.  Her constipation is now resolved after increasing water and vegetables in her diet.  She still has the recurrent cough but now she can sometimes cough up mucus which is sometimes clear but sometimes appears yellow or green.  She denies any fever or chills.  No sensation of shortness of breath.  She does have some sensation of drainage in the back of her throat at times.  Throat pain is improved but she feels as if her throat is irritated at times.  She is not currently taking any prescribed medication for congestion.  Past Medical History:  Diagnosis Date  . Neuromuscular disorder [facility])     Past Surgical History:  Procedure Laterality Date  . NO PAST SURGERIES      Family History  Problem Relation Age of Onset  . Heart disease Mother   . Diabetes Brother   . Diabetes Maternal Aunt   . Diabetes Paternal  Aunt     Social History   Socioeconomic History  . Marital status: Single    Spouse name: Not on file  . Number of children: Not on file  . Years of education: Not on file  . Highest education level: Not on file  Occupational History  . Not on file  Tobacco Use  . Smoking status: Never Smoker  . Smokeless tobacco: Never Used  Substance and Sexual Activity  . Alcohol use: Yes    Alcohol/week: 0.0 standard drinks  . Drug use: No  . Sexual activity: Yes    Birth control/protection: None  Other Topics Concern  . Not on file  Social History Narrative  . Not on file   Social Determinants of Health   Financial Resource Strain:   . Difficulty of Paying Living Expenses: Not on file  Food Insecurity:   . Worried About Charity fundraiser in the Last Year: Not on file  . Ran Out of Food in the Last Year: Not on file  Transportation Needs:   . Lack of Transportation (Medical): Not on file  . Lack of Transportation (Non-Medical): Not on file  Physical Activity:   . Days of Exercise per Week: Not on file  . Minutes of Exercise per Session: Not on file  Stress:   . Feeling of Stress : Not on file  Social Connections:   . Frequency  of Communication with Friends and Family: Not on file  . Frequency of Social Gatherings with Friends and Family: Not on file  . Attends Religious Services: Not on file  . Active Member of Clubs or Organizations: Not on file  . Attends Club or Organization Meetings: Not on file  . Marital Status: Not on file  Intimate Partner Violence:   . Fear of Current or Ex-Partner: Not on file  . Emotionally Abused: Not on file  . Physically Abused: Not on file  . Sexually Abused: Not on file    Outpatient Medications Prior to Visit  Medication Sig Dispense Refill  . cetirizine (ZYRTEC) 10 MG tablet One pill by mouth in the evening to help nasal congestion 30 tablet 2  . famotidine (PEPCID) 20 MG tablet Take 1 tablet (20 mg total) by mouth 2 (two) times  daily. For acid reflux 60 tablet 3  . IBUPROFEN PO Take by mouth.    . amoxicillin-clavulanate (AUGMENTIN) 500-125 MG tablet One pill twice per day take after eating (Patient not taking: Reported on 03/31/2020) 20 tablet 0  . megestrol (MEGACE) 40 MG tablet Take 1 tablet (40 mg total) by mouth daily. (Patient not taking: Reported on 03/04/2020) 60 tablet 3  . meloxicam (MOBIC) 7.5 MG tablet Take 1 tablet (7.5 mg total) by mouth daily. (Patient not taking: Reported on 03/04/2020) 30 tablet 2   No facility-administered medications prior to visit.    No Known Allergies  ROS Review of Systems  Constitutional: Positive for fatigue. Negative for chills and fever.  HENT: Positive for sore throat and trouble swallowing (pain with swallowing).   Respiratory: Positive for cough (non-productive). Negative for shortness of breath.   Cardiovascular: Negative for chest pain, palpitations and leg swelling.  Gastrointestinal: Positive for constipation (has resolved but was present after starting pepcid). Negative for abdominal pain, diarrhea and nausea.  Endocrine: Negative for polydipsia, polyphagia and polyuria.  Genitourinary: Negative for dysuria and frequency.  Musculoskeletal: Positive for arthralgias and back pain.  Skin: Negative for rash and wound.  Neurological: Positive for headaches (frontal). Negative for dizziness.  Hematological: Negative for adenopathy. Does not bruise/bleed easily.  Psychiatric/Behavioral: Positive for sleep disturbance. The patient is nervous/anxious.       Objective:    Physical Exam Vitals and nursing note reviewed.  Constitutional:      General: She is not in acute distress.    Appearance: Normal appearance. She is obese.  HENT:     Head: Normocephalic and atraumatic.     Nose: Congestion present. No rhinorrhea.     Mouth/Throat:     Pharynx: Posterior oropharyngeal erythema present. No oropharyngeal exudate.     Comments: No tonsillar exudate at today's  visit. Posterior pharynx erythema and cobblestoning Neck:     Vascular: No carotid bruit.  Cardiovascular:     Rate and Rhythm: Normal rate and regular rhythm.  Pulmonary:     Effort: Pulmonary effort is normal.     Breath sounds: Normal breath sounds.  Abdominal:     Palpations: Abdomen is soft.     Tenderness: There is no abdominal tenderness. There is no right CVA tenderness, left CVA tenderness, guarding or rebound.  Musculoskeletal:     Cervical back: Normal range of motion and neck supple. Tenderness (c/o tenderness to palp at area of right superior cervical chain/subglossal area but no LAD) present. No rigidity.     Right lower leg: No edema.     Left lower leg: No edema.    Lymphadenopathy:     Cervical: No cervical adenopathy.  Skin:    General: Skin is warm and dry.  Neurological:     Mental Status: She is alert and oriented to person, place, and time.     Cranial Nerves: No cranial nerve deficit.  Psychiatric:        Mood and Affect: Mood normal.        Behavior: Behavior normal.     Comments: Slightly flattened affect but otherwise interacts appropriately     BP 116/78 (BP Location: Left Arm, Patient Position: Sitting, Cuff Size: Normal)   Pulse 80   Temp 98.1 F (36.7 C) (Temporal)   Resp 16   Ht 5' 1" (1.549 m)   Wt 156 lb 6.4 oz (70.9 kg)   SpO2 98%   BMI 29.55 kg/m  Wt Readings from Last 3 Encounters:  03/31/20 156 lb 6.4 oz (70.9 kg)  03/04/20 156 lb 12.8 oz (71.1 kg)  05/08/16 163 lb (73.9 kg)     Health Maintenance Due  Topic Date Due  . Hepatitis C Screening  Never done  . HIV Screening  Never done  . PAP SMEAR-Modifier  08/01/2017  . INFLUENZA VACCINE  Never done     Lab Results  Component Value Date   TSH 1.13 04/10/2016   Lab Results  Component Value Date   WBC 10.5 04/10/2016   HGB 11.7 04/10/2016   HCT 36.6 04/10/2016   MCV 75.6 (L) 04/10/2016   PLT 380 04/10/2016   Lab Results  Component Value Date   NA 139 11/16/2014   K  3.7 11/16/2014   CO2 24 11/16/2014   GLUCOSE 66 (L) 11/16/2014   BUN 7 11/16/2014   CREATININE 0.55 11/16/2014   BILITOT 0.4 11/16/2014   ALKPHOS 99 11/16/2014   AST 22 11/16/2014   ALT 18 11/16/2014   PROT 7.0 11/16/2014   ALBUMIN 3.6 11/16/2014   CALCIUM 8.9 11/16/2014   Lab Results  Component Value Date   CHOL 197 08/01/2014   Lab Results  Component Value Date   HDL 49 08/01/2014   Lab Results  Component Value Date   LDLCALC 107 (H) 08/01/2014   Lab Results  Component Value Date   TRIG 203 (H) 08/01/2014   Lab Results  Component Value Date   CHOLHDL 4.0 08/01/2014   Lab Results  Component Value Date   HGBA1C 5.5 08/01/2014      Assessment & Plan:  1. Recurrent cough; 2. Sore throat; 3.  Gastroesophageal reflux disease, unspecified whether esophagitis present; 4.  Purulent postnasal drainage Discussed with patient that the tonsillitis from her last visit appears to have resolved.  Her continued sore throat/cough is likely related to her current purulent postnasal drainage.  Prescription provided for loratadine to help with nasal congestion/postnasal drainage and prescription for azithromycin provided.  Additionally, she continues to have issues with acid reflux but was unable to continue the Pepcid as she felt that this contributed to issues with constipation.  Discussed with patient that acid reflux can also cause/contribute to sore throat and cough.  Prescription will be provided for patient to try omeprazole 20 mg twice daily to see if this helps with reflux symptoms and her recurrent cough.  She will be seen in follow-up in approximately 5 weeks but should call or return sooner if she has any worsening of sore throat, reflux symptoms or any inability to tolerate the prescribed medications. - loratadine (CLARITIN) 10 MG tablet; Take 1 tablet (10 mg   total) by mouth daily. As needed for congestion  Dispense: 30 tablet; Refill: 11 - azithromycin (ZITHROMAX) 250 MG tablet;  Two pills today then one pill daily for 4 days  Dispense: 6 tablet; Refill: 0 -- omeprazole (PRILOSEC) 20 MG capsule; Take 1 capsule (20 mg total) by mouth in the morning and at bedtime. To reduce stomach acid  Dispense: 60 capsule; Refill: 3  5. Need for immunization against influenza Patient was offered and agreed to have influenza immunization at today's visit.  Educational handout regarding influenza immunization in Spanish was provided to the patient. - Flu Vaccine QUAD 36+ mos IM  6. Language barrier Video interpretation system used to help with language barrier and improve communication of health information.   7. Other chronic pain 8. GAD (generalized anxiety disorder) Per social worker who met with the patient after her visit with me, patient expressed anxiety as well as issues with chronic pain related to carpal tunnel syndrome as well as back/shoulder pain. Discussed with social worker that I can prescribe duloxetine which may help with pain as well as anxiety/depression and can follow-up with patient at her next visit to see if this has helped and social worker will notify patient regarding the new medication - DULoxetine (CYMBALTA) 30 MG capsule; Take 1 capsule (30 mg total) by mouth 2 (two) times daily.  Dispense: 60 capsule; Refill: 1    Follow-up: Return in about 5 weeks (around 05/05/2020) for sore throat/cough.  Call or return sooner if needed.   Cammie Fulp, MD 

## 2020-03-31 NOTE — Progress Notes (Signed)
Reports did not complete Pepcid, caused constipation   Would like flu vaccine today if not contradicted

## 2020-04-23 NOTE — BH Specialist Note (Signed)
Integrated Behavioral Health Initial Visit  MRN: [id number] Name: Julie Herman  Number of Integrated Behavioral Health Clinician visits:: 1/6 Session Start time: 11:15 AM  Session End time: 11:40 AM Total time: 25  Type of Service: Integrated Behavioral Health- Individual Interpretor:Yes.   Interpretor Name and Language: Jon [id number] Spanish   Warm Hand Off Completed.       SUBJECTIVE: Julie Herman is a 44 y.o. female accompanied by self Patient was referred by Dr. Jillyn Hidden for depression and anxiety. Patient reports the following symptoms/concerns: Pt reports difficulty managing depression and anxiety symptoms, in addition, to ongoing medical conditions resulting in financial strain Duration of problem: Ongoing; Severity of problem: mild  OBJECTIVE: Mood: Anxious and Affect: Appropriate Risk of harm to self or others: No plan to harm self or others Pt reports hx of suicidal ideations; however, denies current SI/HI. Protective factors identified and crisis intervention resources provided  LIFE CONTEXT: Family and Social: Pt receives strong support from family. She resides with nephews and children School/Work: Pt is unable to work due to chronic pain. She receives financial support from nephew and is interested in applying for [employer] and [employer] Self-Care: Pt has agreed to participate in medication management through PCP Life Changes: Pt reports difficulty managing pain resulting in inability to work resulting in financial strain.   GOALS ADDRESSED: Patient will: 1. Reduce symptoms of: anxiety and depression Pt agreed to comply with medication management 2. Increase knowledge and/or ability of: coping skills Pt agreed to utilize self-care strategies discussed   INTERVENTIONS: Interventions utilized: Solution-Focused Strategies  Standardized Assessments completed: GAD-7 and PHQ 2&9  ASSESSMENT: Patient currently experiencing difficulty  managing depression and anxiety symptoms triggered by chronic pain and ongoing medical conditions. Reported symptoms have decreased as evidenced by decreased scores on phq9 and gad7.   Pt reported hx of SI/HI; however, denied current plan or intent. Protective factors were identified and crisis intervention resources provided.   Pt will benefit from therapy and continued medication management.    PLAN: 1. Follow up with behavioral health clinician on : Contact LCSW with any additional behavioral health and/or resource needs 2. Behavioral recommendations: Comply with meds and utilize strategies discussed 3. Referral(s): Integrated Behavioral Health Services (In Clinic) 4. "From scale of 1-10, how likely are you to follow plan?":   Bridgett Larsson, LCSW 04/23/2020 9:20 AM

## 2020-05-04 MED FILL — LORATADINE 10 MG TABLET: 10 | 30 days supply | Qty: 30 | Fill #1

## 2020-05-12 ENCOUNTER — Telehealth: Payer: Self-pay | Admitting: Family Medicine

## 2020-05-12 NOTE — Telephone Encounter (Signed)
Copied from CRM [id number]. Topic: General - Other >> May 12, 2020  3:03 PM Elliot Gault wrote: Reason for CRM: patient would like to speak with Mikle Bosworth regarding orange card please call back # [phone]

## 2020-05-13 ENCOUNTER — Ambulatory Visit: Payer: Self-pay | Admitting: *Deleted

## 2020-05-13 NOTE — Telephone Encounter (Signed)
Pt was call back an inform to bring the bill to [place]

## 2020-05-13 NOTE — Telephone Encounter (Signed)
Patient is calling to report a worsening in her chronic arm pain- patient states she has started having numbness in her finger. Patient has been diagnosed with carpel tunnel in the past- but her symptoms are getting worse. She is having significant pain now and numbness in her fingers at times. She states her pain is bilateral. Reason for Disposition . Numbness (i.e., loss of sensation) in hand or fingers  Answer Assessment - Initial Assessment Questions 1. ONSET: "When did the pain start?"     Some time ago- getting worse. At least year 2. LOCATION: "Where is the pain located?"     Elbow to hand/fingers- both- R is worse 3. PAIN: "How bad is the pain?" (Scale 1-10; or mild, moderate, severe)   - MILD (1-3): doesn't interfere with normal activities   - MODERATE (4-7): interferes with normal activities (e.g., work or school) or awakens from sleep   - SEVERE (8-10): excruciating pain, unable to do any normal activities, unable to hold a cup of water     Moderate/severe 4. WORK OR EXERCISE: "Has there been any recent work or exercise that involved this part of the body?"     No- but sometimes it has swelling - patient had been diagnosed with carpel tunnel 5. CAUSE: "What do you think is causing the arm pain?"     Carpel tunnel  6. OTHER SYMPTOMS: "Do you have any other symptoms?" (e.g., neck pain, swelling, rash, fever, numbness, weakness)     Numbness in fingers, back pain 7. PREGNANCY: "Is there any chance you are pregnant?" "When was your last menstrual period?"     Not asked  Protocols used: ARM PAIN-A-AH

## 2020-05-14 ENCOUNTER — Encounter: Payer: Self-pay | Admitting: Nurse Practitioner

## 2020-05-14 ENCOUNTER — Other Ambulatory Visit: Payer: Self-pay | Admitting: Nurse Practitioner

## 2020-05-14 ENCOUNTER — Ambulatory Visit: Payer: Self-pay | Attending: Nurse Practitioner | Admitting: Nurse Practitioner

## 2020-05-14 ENCOUNTER — Other Ambulatory Visit: Payer: Self-pay

## 2020-05-14 VITALS — BP 123/72 | HR 83 | Ht 61.0 in | Wt 163.0 lb

## 2020-05-14 DIAGNOSIS — E7800 Pure hypercholesterolemia, unspecified: Secondary | ICD-10-CM

## 2020-05-14 DIAGNOSIS — M79642 Pain in left hand: Secondary | ICD-10-CM

## 2020-05-14 DIAGNOSIS — E669 Obesity, unspecified: Secondary | ICD-10-CM

## 2020-05-14 DIAGNOSIS — M79641 Pain in right hand: Secondary | ICD-10-CM

## 2020-05-14 DIAGNOSIS — K219 Gastro-esophageal reflux disease without esophagitis: Secondary | ICD-10-CM

## 2020-05-14 MED ORDER — OMEPRAZOLE 20 MG PO CPDR: 20.0000 mg | DELAYED_RELEASE_CAPSULE | Freq: Two times a day (BID) | ORAL | 3 refills | Status: DC

## 2020-05-14 MED ORDER — DICLOFENAC SODIUM 1 % EX GEL: 2.0000 g | Freq: Four times a day (QID) | CUTANEOUS | 1 refills | Status: DC

## 2020-05-14 MED ORDER — FAMOTIDINE 20 MG PO TABS
20.0000 mg | ORAL_TABLET | Freq: Every day | ORAL | 1 refills | Status: DC
Start: 2020-05-14 — End: 2020-08-06

## 2020-05-14 MED ORDER — GABAPENTIN 100 MG PO CAPS: 100.0000 mg | ORAL_CAPSULE | Freq: Every day | ORAL | 0 refills | Status: DC

## 2020-05-14 MED ORDER — DICLOFENAC SODIUM 1 % EX GEL: CUTANEOUS | 1 refills | Status: DC

## 2020-05-14 NOTE — Patient Instructions (Signed)
Sndrome del tnel carpiano Carpal Tunnel Syndrome  El sndrome del tnel carpiano es una afeccin que causa dolor en la mano y en el brazo. El tnel carpiano es un rea estrecha que se encuentra en el lado palmar de la mueca. Los movimientos repetidos de la mueca o determinadas enfermedades pueden causar hinchazn en el tnel. Esta hinchazn puede comprimir el nervio principal de la mueca (nervio mediano). Cules son las causas? Esta afeccin puede ser causada por lo siguiente:  Movimientos repetidos de la mueca.  Lesiones en la mueca.  Artritis.  Un saco lleno de lquido (quiste) o un crecimiento anormal (tumor) en el tnel carpiano.  Acumulacin de lquido durante el embarazo. Algunas veces, la causa no se conoce. Qu incrementa el riesgo? Los siguientes factores pueden hacer que sea ms propenso a contraer esta afeccin:  Tener un trabajo que exige mover la mueca del mismo modo muchas veces. Esto incluye trabajos como el de carnicero o el de cajero.  Ser mujer.  Tener otras afecciones, por ejemplo: ? Diabetes. ? Obesidad. ? Una glndula tiroidea que no es lo suficientemente activa (hipotiroidismo). ? Insuficiencia renal. Cules son los signos o sntomas? Los sntomas de esta afeccin incluyen:  Sensacin de hormigueo en los dedos.  Hormigueo o prdida de la sensibilidad (adormecimiento) en la mano.  Dolor en todo el brazo. Este dolor puede empeorar al flexionar la mueca y el codo durante mucho tiempo.  Dolor en la mueca que sube por el brazo hasta el hombro.  Dolor que baja hasta la palma de la mano o los dedos.  Sensacin de debilidad en las manos. Puede resultarle difcil tomar y sostener objetos. Es posible que se sienta peor por la noche. Cmo se diagnostica? Esta afeccin se diagnostica mediante los antecedentes mdicos y un examen fsico. Tambin pueden hacerle estudios, por ejemplo:  Una electromiograma (EMG). Este estudio comprueba las seales  que los nervios envan a los msculos.  Estudio de conduccin nerviosa. Este estudio comprueba si las seales pasan correctamente por los nervios.  Estudios de diagnstico por imgenes, como radiografas, una ecografa y una resonancia magntica (RM). Estos estudios permiten detectar cul podra ser la causa de su afeccin. Cmo se trata? El tratamiento de esta afeccin puede incluir:  Cambios en el estilo de vida. Se le pedir que deje o cambie la actividad que caus el problema.  Hacer ejercicio y actividades para que los msculos y los huesos se vuelvan ms fuertes (fisioterapia).  Aprender a usar la mano nuevamente (terapia ocupacional).  Medicamentos para tratar el dolor y la hinchazn (inflamacin). Es posible que le apliquen inyecciones en la mueca.  Una frula para la mueca.  Una ciruga. Siga estas indicaciones en su casa: Si tiene una frula:  Use la frula como se lo haya indicado el mdico. Quteselo solamente como se lo haya indicado el mdico.  Afloje la frula si los dedos: ? Hormiguean. ? Pierden la sensibilidad (se adormecen). ? Se tornan fros y de color azul.  Mantenga la frula limpia.  Si la frula no es impermeable: ? No deje que se moje. ? Cbralo con un envoltorio hermtico cuando tome un bao de inmersin o una ducha. Control del dolor, la rigidez y la hinchazn   Si se lo indican, aplique hielo sobre la zona dolorida: ? Si tiene una frula desmontable, qutesela como se lo haya indicado el mdico. ? Ponga el hielo en una bolsa plstica. ? Coloque una toalla entre la piel y la bolsa. ? Coloque el hielo   durante , 2a3veces al [employer]. Indicaciones generales  [employer] de venta libre y los recetados solamente como se lo haya indicado el mdico.  Descanse la [place] de cualquier actividad que le cause dolor. Si es necesario, hable con su jefe en el [employer] cambios que pueden ayudar a la curacin de la [place].  Haga  los ejercicios como se lo hayan indicado el mdico, el fisioterapeuta o el terapeuta ocupacional.  Concurra a todas las visitas de seguimiento como se lo haya indicado el mdico. Esto es importante. Comunquese con un mdico si:  Aparecen nuevos sntomas.  Los medicamentos no Tourist information centre manager.  Sus sntomas empeoran. Solicite ayuda de inmediato si:  Tiene adormecimiento u hormigueo muy intensos en la mueca o la [place]. Resumen  El sndrome del tnel carpiano es una afeccin que causa dolor en la mano y en el brazo.  Suele deberse a movimientos repetidos de Warden/ranger.  Este problema se trata mediante cambios en el estilo de vida y medicamentos. La ciruga puede ser necesaria en casos muy graves.  Siga las indicaciones del mdico sobre el uso de una frula, el reposo de la [place], la asistencia a las consultas de control y Freight forwarder para pedir ayuda. Esta informacin no tiene Theme park manager el consejo del mdico. Asegrese de hacerle al mdico cualquier pregunta que tenga. Document Revised: 11/22/2017 Document Reviewed: 11/22/2017 Elsevier Patient Education  2020 [employer].  Prevencin del sndrome del tnel carpiano Preventing Carpal Tunnel Syndrome  El sndrome del tnel carpiano es una afeccin que causa dolor, adormecimiento y debilidad en la [place], la mano y los dedos. El tnel carpiano es una cavidad angosta que se encuentra en la [place]. Por el tnel carpiano pasan varios tendones y [employer] nervios de la mano (nervio [place]). El nervio mediano da sensibilidad al pulgar y a los primeros tres dedos. Tambin a los msculos que estn en la base del pulgar. El sndrome del tnel carpiano sucede cuando el nervio mediano es apretado en el rea donde pasa por el tnel carpiano. En algunos casos, quizs no sea posible prevenir el sndrome del tnel carpiano. Sin embargo, usted puede tomar medidas para Technical sales engineer presin sobre la [place] y reducir el riesgo de  Interior and spatial designer. Cmo puede afectarme esta enfermedad? El sndrome del tnel carpiano puede afectar su capacidad para hacer trabajos o actividades que involucren la accin de la mano, la [place] y los dedos. Puede provocar sntomas como los siguientes:  Dolor en la [place], la mano y los dedos.  Ardor, hormigueo o adormecimiento en la zona afectada.  Sensacin de [employer]. Tal vez tenga dificultad para tomar y Personal assistant. Los sntomas pueden empeorar con [employer]. En algunas personas, los sntomas empeoran por la noche. Qu puede aumentar el riesgo? Los siguientes factores pueden hacer que sea ms propenso a Clinical cytogeneticist afeccin:  Tener un trabajo que requiera que mueva la [place] repetitivamente o que utilice herramientas que vibran. Estos pueden ser, [employer], los trabajos que implican usar una computadora, trabajar en una lnea de ensamblaje o trabajar con herramientas elctricas como taladros y lijadoras.  Ser mujer.  Tener antecedentes familiares de esta afeccin.  Tener ciertas afecciones, tales como: ? Diabetes. ? Embarazo. ? Obesidad. ? Enfermedad tiroidea. ? Artritis reumatoide. Qu medidas puedo tomar para ayudar a prevenir esta afeccin?      Evite hacer movimientos repetitivos con Engineer, site y la mueca que le produzcan rigidez o  dolor en la [place].  Tome descansos frecuentes si [place] las manos y las muecas durante muchas horas seguidas.  Estire las manos y los dedos con frecuencia para que la sangre fluya y se alivie la tensin.  Mantenga las [employer] en la posicin natural al usar el teclado o el mouse de la computadora. No doble las muecas hacia abajo ni [employer].  Si [place] las manos y las muecas durante muchas horas en el [place], [place] cambios en su espacio de trabajo para Paramedic la presin sobre las [place]. Tal vez deba usar lo siguiente: ? Un apoyo acolchado para las muecas, para trabajar con la computadora. ? Un  teclado inclinado. ? Herramientas de mano con mango acolchado para reducir la vibracin.  Considere usar un dispositivo ortopdico para Warden/ranger. Esto no evitar el sndrome del tnel carpiano, pero puede evitar que empeore. Los dispositivos ortopdicos para la muera reducen la flexin y la tensin.  Controle atentamente cualquier afeccin mdica que tenga y que pueda ponerlo en riesgo de tener sndrome del tnel carpiano. Hgase revisar el nivel de azcar en la sangre para asegurarse de que no est desarrollando diabetes. Si tiene diabetes, trabaje con el mdico para mantener bajo control el nivel de Banker. Dnde buscar ms informacin  [employer] of Neurological Disorders and Stroke (Instituto [employer] de Trastornos Neurolgicos y Accidentes Cerebrovasculares): BasicFM.no  American Academy of Family Physicians (Academia Americana de Mdicos de [place]): Hydrologist.org Comunquese con un mdico si:  Tiene adormecimiento u hormigueos en la mueca, la mano o los dedos.  Tiene dolor o sensacin de ardor en la mueca, la mano o los dedos.  El dolor, el hormigueo o el ardor lo despierta de noche.  Siente la mano dbil y torpe.  Se le caen los objetos con frecuencia.  No puede usar las [place] y las manos sin Financial risk analyst. Resumen  El sndrome del tnel carpiano es una afeccin que causa Engineer, mining, adormecimiento y debilidad en la [place], la mano y los dedos.  Usted puede tomar medidas para Paramedic la presin sobre la [place] y reducir el riesgo de Interior and spatial designer.  Evite hacer movimientos repetitivos con la mano y Warden/ranger que le produzcan rigidez o dolor en la [place].  Si [place] las manos y las muecas durante muchas horas en el [place], es recomendable que haga cambios en su espacio de trabajo para Paramedic la presin sobre las [place].  Tome descansos frecuentes para estirar las manos y los dedos. Esta informacin no tiene Theme park manager el  consejo del mdico. Asegrese de hacerle al mdico cualquier pregunta que tenga. Document Revised: 12/18/2017 Document Reviewed: 12/18/2017 Elsevier Patient Education  2020 [employer].

## 2020-05-14 NOTE — Progress Notes (Signed)
Having pain in right wrist up arm.

## 2020-05-14 NOTE — Progress Notes (Signed)
Assessment & Plan:  Julie Herman was seen today for wrist pain.  Diagnoses and all orders for this visit:  Bilateral hand pain -     gabapentin (NEURONTIN) 100 MG capsule; Take 1 capsule (100 mg total) by mouth at bedtime. -     diclofenac Sodium (VOLTAREN) 1 % GEL; Apply to bilateral wrists and hands as needed -     CBC  Gastroesophageal reflux disease, unspecified whether esophagitis present -     omeprazole (PRILOSEC) 20 MG capsule; Take 1 capsule (20 mg total) by mouth in the morning and at bedtime. To reduce stomach acid -     famotidine (PEPCID) 20 MG tablet; Take 1 tablet (20 mg total) by mouth at bedtime. -     CMP14+EGFR  Pure hypercholesterolemia -     Lipid panel  Obesity (BMI 30-39.9) -     Hemoglobin A1c -     TSH     Patient has been counseled on age-appropriate routine health concerns for screening and prevention. These are reviewed and up-to-date. Referrals have been placed accordingly. Immunizations are up-to-date or declined.    Subjective:   Chief Complaint  Patient presents with  . Wrist Pain   HPI Tessica Cupo 44 y.o. female presents to office today with complaints of right wrist and arm pain and left arm pain. R>L.  VRI was used to communicate directly with patient for the entire encounter including providing detailed patient instructions.    Joint Pain: Patient complains of arthralgias and neuropathy for which has been present for only  2 weeks. Pain is located in bilateral wrists and forearms, is described as numbing, shooting and tingling, and is constant .  Associated symptoms include: neck pain.  The patient has tried nothing for pain relief.  Related to injury:   No.  Neck pain  does not radiate down into the arms and hands. She has not tried any medication for her symptoms.   GERD She is only taking omeprazole 20 mg once a day. The second dose makes her constipated. She is still experiencing symptoms of symptoms of heartburn  including epigastric pain and burning.   Review of Systems  Constitutional: Negative for fever, malaise/fatigue and weight loss.  HENT: Negative.  Negative for nosebleeds.   Eyes: Negative.  Negative for blurred vision, double vision and photophobia.  Respiratory: Negative.  Negative for cough and shortness of breath.   Cardiovascular: Negative.  Negative for chest pain, palpitations and leg swelling.  Gastrointestinal: Positive for heartburn. Negative for nausea and vomiting.  Musculoskeletal: Positive for joint pain and neck pain. Negative for myalgias.  Neurological: Negative.  Negative for dizziness, focal weakness, seizures and headaches.  Psychiatric/Behavioral: Negative.  Negative for suicidal ideas.    Past Medical History:  Diagnosis Date  . Neuromuscular disorder [facility])     Past Surgical History:  Procedure Laterality Date  . NO PAST SURGERIES      Family History  Problem Relation Age of Onset  . Heart disease Mother   . Diabetes Brother   . Diabetes Maternal Aunt   . Diabetes Paternal Aunt     Social History Reviewed with no changes to be made today.   Outpatient Medications Prior to Visit  Medication Sig Dispense Refill  . IBUPROFEN PO Take by mouth.     Marland Kitchen omeprazole (PRILOSEC) 20 MG capsule Take 1 capsule (20 mg total) by mouth in the morning and at bedtime. To reduce stomach acid 60 capsule 3  .  amoxicillin-clavulanate (AUGMENTIN) 500-125 MG tablet One pill twice per day take after eating (Patient not taking: Reported on 03/31/2020) 20 tablet 0  . azithromycin (ZITHROMAX) 250 MG tablet Two pills today then one pill daily for 4 days (Patient not taking: Reported on 05/14/2020) 6 tablet 0  . DULoxetine (CYMBALTA) 30 MG capsule Take 1 capsule (30 mg total) by mouth 2 (two) times daily. (Patient not taking: Reported on 05/14/2020) 60 capsule 1  . loratadine (CLARITIN) 10 MG tablet Take 1 tablet (10 mg total) by mouth daily. As needed for congestion (Patient not  taking: Reported on 05/14/2020) 30 tablet 11  . megestrol (MEGACE) 40 MG tablet Take 1 tablet (40 mg total) by mouth daily. (Patient not taking: Reported on 03/04/2020) 60 tablet 3  . meloxicam (MOBIC) 7.5 MG tablet Take 1 tablet (7.5 mg total) by mouth daily. (Patient not taking: Reported on 03/04/2020) 30 tablet 2   No facility-administered medications prior to visit.    No Known Allergies     Objective:    BP 123/72   Pulse 83   Ht '5\' 1"'  (1.549 m)   Wt 163 lb (73.9 kg)   SpO2 99%   BMI 30.80 kg/m  Wt Readings from Last 3 Encounters:  05/14/20 163 lb (73.9 kg)  03/31/20 156 lb 6.4 oz (70.9 kg)  03/04/20 156 lb 12.8 oz (71.1 kg)    Physical Exam Vitals and nursing note reviewed.  Constitutional:      Appearance: She is well-developed.  HENT:     Head: Normocephalic and atraumatic.  Cardiovascular:     Rate and Rhythm: Normal rate and regular rhythm.     Heart sounds: Normal heart sounds. No murmur heard.  No friction rub. No gallop.   Pulmonary:     Effort: Pulmonary effort is normal. No tachypnea or respiratory distress.     Breath sounds: Normal breath sounds. No decreased breath sounds, wheezing, rhonchi or rales.  Chest:     Chest wall: No tenderness.  Abdominal:     General: Bowel sounds are normal.     Palpations: Abdomen is soft.  Musculoskeletal:        General: Normal range of motion.     Right hand: No swelling, deformity or tenderness. Normal range of motion. Normal strength.     Left hand: No swelling, deformity or tenderness. Normal range of motion. Normal strength.     Cervical back: Normal range of motion.  Skin:    General: Skin is warm and dry.  Neurological:     Mental Status: She is alert and oriented to person, place, and time.     Coordination: Coordination normal.  Psychiatric:        Behavior: Behavior normal. Behavior is cooperative.        Thought Content: Thought content normal.        Judgment: Judgment normal.          Patient  has been counseled extensively about nutrition and exercise as well as the importance of adherence with medications and regular follow-up. The patient was given clear instructions to go to ER or return to medical center if symptoms don't improve, worsen or new problems develop. The patient verbalized understanding.   Follow-up: Return in about 4 weeks (around 06/11/2020) for with PCP/FULP for possible Carpal Tunnel syndrome.   Gildardo Pounds, FNP-BC [facility] [place], [place]   05/17/2020, 9:47 PM

## 2020-05-15 LAB — CMP14+EGFR
ALT: 37 IU/L — ABNORMAL HIGH (ref 0–32)
AST: 31 IU/L (ref 0–40)
Albumin/Globulin Ratio: 1.4 (ref 1.2–2.2)
Albumin: 4.2 g/dL (ref 3.8–4.8)
Alkaline Phosphatase: 93 IU/L (ref 44–121)
BUN/Creatinine Ratio: 17 (ref 9–23)
BUN: 9 mg/dL (ref 6–24)
Bilirubin Total: 0.3 mg/dL (ref 0.0–1.2)
CO2: 23 mmol/L (ref 20–29)
Calcium: 9.7 mg/dL (ref 8.7–10.2)
Chloride: 101 mmol/L (ref 96–106)
Creatinine, Ser: 0.54 mg/dL — ABNORMAL LOW (ref 0.57–1.00)
GFR calc Af Amer: 132 mL/min/{1.73_m2} (ref >59)
GFR calc non Af Amer: 114 mL/min/{1.73_m2} (ref >59)
Globulin, Total: 3.1 g/dL (ref 1.5–4.5)
Glucose: 94 mg/dL (ref 65–99)
Potassium: 4.3 mmol/L (ref 3.5–5.2)
Sodium: 137 mmol/L (ref 134–144)
Total Protein: 7.3 g/dL (ref 6.0–8.5)

## 2020-05-15 LAB — CBC
Hematocrit: 35.5 % (ref 34.0–46.6)
Hemoglobin: 11.5 g/dL (ref 11.1–15.9)
MCH: 26.1 pg — ABNORMAL LOW (ref 26.6–33.0)
MCHC: 32.4 g/dL (ref 31.5–35.7)
MCV: 81 fL (ref 79–97)
Platelets: 407 10*3/uL (ref 150–450)
RBC: 4.41 x10E6/uL (ref 3.77–5.28)
RDW: 13.1 % (ref 11.7–15.4)
WBC: 11.6 10*3/uL — ABNORMAL HIGH (ref 3.4–10.8)

## 2020-05-15 LAB — HEMOGLOBIN A1C
Est. average glucose Bld gHb Est-mCnc: 114 mg/dL
Hgb A1c MFr Bld: 5.6 % (ref 4.8–5.6)

## 2020-05-15 LAB — LIPID PANEL
Chol/HDL Ratio: 5.8 ratio — ABNORMAL HIGH (ref 0.0–4.4)
Cholesterol, Total: 209 mg/dL — ABNORMAL HIGH (ref 100–199)
HDL: 36 mg/dL — ABNORMAL LOW (ref >39)
LDL Chol Calc (NIH): 78 mg/dL (ref 0–99)
Triglycerides: 597 mg/dL (ref 0–149)
VLDL Cholesterol Cal: 95 mg/dL — ABNORMAL HIGH (ref 5–40)

## 2020-05-15 LAB — TSH: TSH: 0.993 u[IU]/mL (ref 0.450–4.500)

## 2020-05-17 ENCOUNTER — Encounter: Payer: Self-pay | Admitting: Nurse Practitioner

## 2020-05-17 MED FILL — GABAPENTIN 100 MG CAPSULE: 100 | 30 days supply | Qty: 30 | Fill #0

## 2020-05-17 MED FILL — ?FAMOTIDINE 20MG TABLET: 20 | 30 days supply | Qty: 30 | Fill #0

## 2020-05-17 MED FILL — DICLOFENAC SODIUM 1% GEL: 1 | 10 days supply | Qty: 100 | Fill #0

## 2020-05-17 MED FILL — OMEPRAZOLE 20 MG CAP: 20 | 30 days supply | Qty: 60 | Fill #0

## 2020-05-24 ENCOUNTER — Other Ambulatory Visit: Payer: Self-pay

## 2020-05-24 ENCOUNTER — Ambulatory Visit: Payer: Self-pay | Attending: Family Medicine

## 2020-05-24 DIAGNOSIS — E669 Obesity, unspecified: Secondary | ICD-10-CM

## 2020-05-24 DIAGNOSIS — E7800 Pure hypercholesterolemia, unspecified: Secondary | ICD-10-CM

## 2020-05-25 LAB — LIPID PANEL
Chol/HDL Ratio: 4.5 ratio — ABNORMAL HIGH (ref 0.0–4.4)
Cholesterol, Total: 187 mg/dL (ref 100–199)
HDL: 42 mg/dL (ref >39)
LDL Chol Calc (NIH): 99 mg/dL (ref 0–99)
Triglycerides: 269 mg/dL — ABNORMAL HIGH (ref 0–149)
VLDL Cholesterol Cal: 46 mg/dL — ABNORMAL HIGH (ref 5–40)

## 2020-06-08 MED FILL — LORATADINE 10 MG TABLET: 10 | 30 days supply | Qty: 30 | Fill #2

## 2020-06-09 MED FILL — DICLOFENAC SODIUM 1% GEL: 1 | 10 days supply | Qty: 100 | Fill #1

## 2020-06-14 ENCOUNTER — Other Ambulatory Visit: Payer: Self-pay

## 2020-06-14 ENCOUNTER — Encounter: Payer: Self-pay | Admitting: Nurse Practitioner

## 2020-06-14 ENCOUNTER — Ambulatory Visit: Payer: Self-pay | Attending: Nurse Practitioner | Admitting: Nurse Practitioner

## 2020-06-14 DIAGNOSIS — M79641 Pain in right hand: Secondary | ICD-10-CM

## 2020-06-14 MED ORDER — DULOXETINE HCL 30 MG PO CPEP: 30.0000 mg | ORAL_CAPSULE | Freq: Every day | ORAL | 1 refills | Status: DC

## 2020-06-14 MED FILL — ?DULoxetine HCL 30MG CPEP: 30 | 30 days supply | Qty: 30 | Fill #0

## 2020-06-14 NOTE — Progress Notes (Signed)
Virtual Visit via Telephone Note Due to national recommendations of social distancing due to COVID 19, telehealth visit is felt to be most appropriate for this patient at this time.  I discussed the limitations, risks, security and privacy concerns of performing an evaluation and management service by telephone and the availability of in person appointments. I also discussed with the patient that there may be a patient responsible charge related to this service. The patient expressed understanding and agreed to proceed.    I connected with Julie Herman on 06/14/20  at   9:30 AM EST  EDT by telephone and verified that I am speaking with the correct person using two identifiers.   Consent I discussed the limitations, risks, security and privacy concerns of performing an evaluation and management service by telephone and the availability of in person appointments. I also discussed with the patient that there may be a patient responsible charge related to this service. The patient expressed understanding and agreed to proceed.   Location of Patient: Private Residence    Location of Provider: [facility]    Persons participating in Telemedicine visit: Bertram Denver FNP-BC YY Seth Ward CMA Adan Sis Dorathy Kinsman  Spanish Interpreter ID# [id number]   History of Present Illness: Telemedicine visit for: Follow up to Joint Pain  She was treated by me on 11-12 for right wrist and arm pain and left arm pain with Neurontin and Voltaren gel. Today she states she is still experiencing pain in her right hand which is worse at night.  Pain is located in bilateral wrists and forearms, is described as numbing, shooting and tingling, and is constant .  Associated symptoms include: neck pain.  The patient has tried nothing for pain relief.  Related to injury:   No.  Neck pain  does not radiate down into the arms and hands. She has not tried any medication for  her symptoms. She denies any work that requires repetitive activity of her hands. Onset of pain was over 1 month ago.     Past Medical History:  Diagnosis Date  . Neuromuscular disorder [facility])     Past Surgical History:  Procedure Laterality Date  . NO PAST SURGERIES      Family History  Problem Relation Age of Onset  . Heart disease Mother   . Diabetes Brother   . Diabetes Maternal Aunt   . Diabetes Paternal Aunt     Social History   Socioeconomic History  . Marital status: Single    Spouse name: Not on file  . Number of children: Not on file  . Years of education: Not on file  . Highest education level: Not on file  Occupational History  . Not on file  Tobacco Use  . Smoking status: Never Smoker  . Smokeless tobacco: Never Used  Substance and Sexual Activity  . Alcohol use: Yes    Alcohol/week: 0.0 standard drinks  . Drug use: No  . Sexual activity: Yes    Birth control/protection: None  Other Topics Concern  . Not on file  Social History Narrative  . Not on file   Social Determinants of Health   Financial Resource Strain: Not on file  Food Insecurity: Not on file  Transportation Needs: Not on file  Physical Activity: Not on file  Stress: Not on file  Social Connections: Not on file     Observations/Objective: Awake, alert and oriented x 3   Review of Systems  Constitutional: Negative  for fever, malaise/fatigue and weight loss.  HENT: Negative.  Negative for nosebleeds.   Eyes: Negative.  Negative for blurred vision, double vision and photophobia.  Respiratory: Negative.  Negative for cough and shortness of breath.   Cardiovascular: Negative.  Negative for chest pain, palpitations and leg swelling.  Gastrointestinal: Negative.  Negative for heartburn, nausea and vomiting.  Musculoskeletal: Positive for joint pain. Negative for myalgias.  Neurological: Negative.  Negative for dizziness, focal weakness, seizures and headaches.  Psychiatric/Behavioral:  Negative.  Negative for suicidal ideas.    Assessment and Plan: Julie Herman was seen today for follow-up.  Diagnoses and all orders for this visit:  Pain of right hand -     Ambulatory referral to Hand Surgery -     DULoxetine (CYMBALTA) 30 MG capsule; Take 1 capsule (30 mg total) by mouth daily.     Follow Up Instructions Return in about 4 weeks (around 07/12/2020).     I discussed the assessment and treatment plan with the patient. The patient was provided an opportunity to ask questions and all were answered. The patient agreed with the plan and demonstrated an understanding of the instructions.   The patient was advised to call back or seek an in-person evaluation if the symptoms worsen or if the condition fails to improve as anticipated.  I provided 16 minutes of non-face-to-face time during this encounter including median intraservice time, reviewing previous notes, labs, imaging, medications and explaining diagnosis and management.  Claiborne Rigg, FNP-BC

## 2020-06-17 ENCOUNTER — Encounter: Payer: Self-pay | Admitting: Orthopaedic Surgery

## 2020-06-17 ENCOUNTER — Other Ambulatory Visit: Payer: Self-pay

## 2020-06-17 ENCOUNTER — Ambulatory Visit (INDEPENDENT_AMBULATORY_CARE_PROVIDER_SITE_OTHER): Payer: Self-pay | Admitting: Orthopaedic Surgery

## 2020-06-17 ENCOUNTER — Other Ambulatory Visit: Payer: Self-pay | Admitting: Orthopaedic Surgery

## 2020-06-17 VITALS — Ht 61.0 in | Wt 163.0 lb

## 2020-06-17 DIAGNOSIS — G5602 Carpal tunnel syndrome, left upper limb: Secondary | ICD-10-CM

## 2020-06-17 DIAGNOSIS — G5601 Carpal tunnel syndrome, right upper limb: Secondary | ICD-10-CM

## 2020-06-17 MED ORDER — METHYLPREDNISOLONE 4 MG PO TBPK: ORAL_TABLET | ORAL | 0 refills | Status: DC

## 2020-06-17 MED FILL — METHYLPREDNISOLONE 4 MG TAB: 4 | 6 days supply | Qty: 21 | Fill #0

## 2020-06-17 NOTE — Addendum Note (Signed)
Addended by: Wendi Maya on: 06/17/2020 09:10 AM   Modules accepted: Orders

## 2020-06-17 NOTE — Progress Notes (Signed)
Office Visit Note   Patient: Argusta Mcgann           Date of Birth: June 07, 1976           MRN: [id number] Visit Date: 06/17/2020              Requested by: Claiborne Rigg, NP [address] [place],  [place] [address] PCP: Cain Saupe, MD   Assessment & Plan: Visit Diagnoses:  1. Right carpal tunnel syndrome   2. Left carpal tunnel syndrome     Plan: Impression is bilateral carpal tunnel syndrome.  She has tried gabapentin and Cymbalta that was prescribed for PCP and she has not noticed any improvement.  We will try Medrol Dosepak.  We will need to obtain nerve conduction studies to assess for carpal tunnel syndrome.  Follow-up after the studies.  Follow-Up Instructions: Return if symptoms worsen or fail to improve.   Orders:  No orders of the defined types were placed in this encounter.  Meds ordered this encounter  Medications  . methylPREDNISolone (MEDROL DOSEPAK) 4 MG TBPK tablet    Sig: Use as directed    Dispense:  21 tablet    Refill:  0      Procedures: No procedures performed   Clinical Data: No additional findings.   Subjective: Chief Complaint  Patient presents with  . Right Hand - Pain    Ms. Dorathy Kinsman comes in today for referral from PCP for bilateral hand and arm pain for about several years with recent worsening the last 2 months.  She has been forced to quit her job due to constant pain and numbness that is worse at night and early morning.  She feels that she has no strength in her hands.  She has worn carpal tunnel braces with partial relief.  Denies any injuries.  She is here with the language interpreter today.   Review of Systems  Constitutional: Negative.   HENT: Negative.   Eyes: Negative.   Respiratory: Negative.   Cardiovascular: Negative.   Endocrine: Negative.   Musculoskeletal: Negative.   Neurological: Negative.   Hematological: Negative.   Psychiatric/Behavioral: Negative.   All other systems  reviewed and are negative.    Objective: Vital Signs: Ht 5\' 1"  (1.549 m)   Wt 163 lb (73.9 kg)   BMI 30.80 kg/m   Physical Exam Vitals and nursing note reviewed.  Constitutional:      Appearance: She is well-developed and well-nourished.  HENT:     Head: Normocephalic and atraumatic.  Eyes:     Extraocular Movements: EOM normal.  Pulmonary:     Effort: Pulmonary effort is normal.  Abdominal:     Palpations: Abdomen is soft.  Musculoskeletal:     Cervical back: Neck supple.  Skin:    General: Skin is warm.     Capillary Refill: Capillary refill takes less than 2 seconds.  Neurological:     Mental Status: She is alert and oriented to person, place, and time.  Psychiatric:        Mood and Affect: Mood and affect normal.        Behavior: Behavior normal.        Thought Content: Thought content normal.        Judgment: Judgment normal.     Ortho Exam Bilateral hands show positive Durkin's and positive Phalen's.  No muscle atrophy.  No neurovascular compromise.  No swelling masses or lesions. Specialty Comments:  No specialty comments available.  Imaging: No results found.   PMFS History: Patient Active Problem List   Diagnosis Date Noted  . Menorrhagia 04/10/2016  . Language barrier 09/14/2014  . Hyperlipidemia 04/10/2014   Past Medical History:  Diagnosis Date  . Neuromuscular disorder (HCC)     Family History  Problem Relation Age of Onset  . Heart disease Mother   . Diabetes Brother   . Diabetes Maternal Aunt   . Diabetes Paternal Aunt     Past Surgical History:  Procedure Laterality Date  . NO PAST SURGERIES     Social History   Occupational History  . Not on file  Tobacco Use  . Smoking status: Never Smoker  . Smokeless tobacco: Never Used  Substance and Sexual Activity  . Alcohol use: Yes    Alcohol/week: 0.0 standard drinks  . Drug use: No  . Sexual activity: Yes    Birth control/protection: None

## 2020-06-18 ENCOUNTER — Ambulatory Visit: Payer: No Typology Code available for payment source | Admitting: Family Medicine

## 2020-06-27 ENCOUNTER — Encounter: Payer: Self-pay | Admitting: Nurse Practitioner

## 2020-07-15 ENCOUNTER — Other Ambulatory Visit: Payer: Self-pay | Admitting: Family Medicine

## 2020-07-15 DIAGNOSIS — J029 Acute pharyngitis, unspecified: Secondary | ICD-10-CM

## 2020-07-15 DIAGNOSIS — J329 Chronic sinusitis, unspecified: Secondary | ICD-10-CM

## 2020-07-20 ENCOUNTER — Ambulatory Visit: Payer: Self-pay | Attending: Family Medicine | Admitting: Family Medicine

## 2020-07-20 ENCOUNTER — Encounter: Payer: Self-pay | Admitting: Family Medicine

## 2020-07-20 ENCOUNTER — Other Ambulatory Visit: Payer: Self-pay | Admitting: Family Medicine

## 2020-07-20 ENCOUNTER — Other Ambulatory Visit: Payer: Self-pay

## 2020-07-20 VITALS — BP 120/76 | HR 79 | Temp 98.5°F | Resp 18 | Ht 61.0 in | Wt 163.0 lb

## 2020-07-20 DIAGNOSIS — R635 Abnormal weight gain: Secondary | ICD-10-CM

## 2020-07-20 DIAGNOSIS — H538 Other visual disturbances: Secondary | ICD-10-CM

## 2020-07-20 MED ORDER — HYPROMELLOSE (GONIOSCOPIC) 2.5 % OP SOLN: 1.0000 [drp] | Freq: Four times a day (QID) | OPHTHALMIC | 1 refills | Status: DC | PRN

## 2020-07-20 NOTE — Progress Notes (Signed)
Subjective:  Patient ID: Julie Herman, female    DOB: Dec 31, 1975  Age: 45 y.o. MRN: [id number]  CC: Blurred Vision   HPI Julie Herman presents today with concerns of weight gain. She has gained 9 lbs in 3 months. Of note she was on a Medrol dose pack last Month prescribed by Orthopedics.She endorses an increased appetite and eating late.  She complains of visual abnormalities She has double vision in the R eye. Used Honey in her eyes in the past with some relief. She does not wear glasses. Denies presence of headaches, tearing of her eyes. Requesting eyedrops as she states her friend had similar symptoms and eyedrops were beneficial.  Past Medical History:  Diagnosis Date  . Neuromuscular disorder [facility])     Past Surgical History:  Procedure Laterality Date  . NO PAST SURGERIES      Family History  Problem Relation Age of Onset  . Heart disease Mother   . Diabetes Brother   . Diabetes Maternal Aunt   . Diabetes Paternal Aunt     No Known Allergies  Outpatient Medications Prior to Visit  Medication Sig Dispense Refill  . DULoxetine (CYMBALTA) 30 MG capsule Take 1 capsule (30 mg total) by mouth daily. (Patient not taking: Reported on 07/20/2020) 30 capsule 1  . famotidine (PEPCID) 20 MG tablet Take 1 tablet (20 mg total) by mouth at bedtime. (Patient not taking: Reported on 07/20/2020) 30 tablet 1  . omeprazole (PRILOSEC) 20 MG capsule Take 1 capsule (20 mg total) by mouth in the morning and at bedtime. To reduce stomach acid (Patient not taking: Reported on 07/20/2020) 60 capsule 3  . methylPREDNISolone (MEDROL DOSEPAK) 4 MG TBPK tablet Use as directed 21 tablet 0   No facility-administered medications prior to visit.     ROS Review of Systems  Constitutional: Positive for appetite change. Negative for activity change and fatigue.  HENT: Negative for congestion, sinus pressure and sore throat.   Eyes: Negative for visual disturbance.   Respiratory: Negative for cough, chest tightness, shortness of breath and wheezing.   Cardiovascular: Negative for chest pain and palpitations.  Gastrointestinal: Negative for abdominal distention, abdominal pain and constipation.  Endocrine: Negative for polydipsia.  Genitourinary: Negative for dysuria and frequency.  Musculoskeletal: Negative for arthralgias and back pain.  Skin: Negative for rash.  Neurological: Negative for tremors, light-headedness and numbness.  Hematological: Does not bruise/bleed easily.  Psychiatric/Behavioral: Negative for agitation and behavioral problems.    Objective:  BP 120/76 (BP Location: Left Arm, Patient Position: Sitting, Cuff Size: Normal)   Pulse 79   Temp 98.5 F (36.9 C) (Oral)   Resp 18   Ht 5\' 1"  (1.549 m)   Wt 163 lb (73.9 kg)   LMP 06/30/2020   SpO2 99%   BMI 30.80 kg/m   BP/Weight 07/20/2020 06/17/2020 05/14/2020  Systolic BP 120 - 123  Diastolic BP 76 - 72  Wt. (Lbs) 163 163 163  BMI 30.8 30.8 30.8      Physical Exam Constitutional:      Appearance: She is well-developed. She is obese.  Eyes:     Extraocular Movements: Extraocular movements intact.     Conjunctiva/sclera: Conjunctivae normal.     Pupils: Pupils are equal, round, and reactive to light.  Neck:     Vascular: No JVD.  Cardiovascular:     Rate and Rhythm: Normal rate.     Heart sounds: Normal heart sounds. No murmur heard.   Pulmonary:  Effort: Pulmonary effort is normal.     Breath sounds: Normal breath sounds. No wheezing or rales.  Chest:     Chest wall: No tenderness.  Abdominal:     General: Bowel sounds are normal. There is no distension.     Palpations: Abdomen is soft. There is no mass.     Tenderness: There is no abdominal tenderness.  Musculoskeletal:        General: Normal range of motion.     Right lower leg: No edema.     Left lower leg: No edema.  Neurological:     Mental Status: She is alert and oriented to person, place, and  time.  Psychiatric:        Mood and Affect: Mood normal.     Visual Acuity Screening   Right eye Left eye Both eyes  Without correction: 20/25 20/15 20/20   With correction:          CMP Latest Ref Rng & Units 05/14/2020 11/16/2014 08/01/2014  Glucose 65 - 99 mg/dL 94 08/03/2014) 78  BUN 6 - 24 mg/dL 9 7 9   Creatinine 0.57 - 1.00 mg/dL 76(H) 6.07(P  Sodium 134 - 144 mmol/L 137 139 136  Potassium 3.5 - 5.2 mmol/L 4.3 3.7 4.4  Chloride 96 - 106 mmol/L 101 104 103  CO2 20 - 29 mmol/L 23 24 24   Calcium 8.7 - 10.2 mg/dL 9.7 8.9 9.3  Total Protein 6.0 - 8.5 g/dL 7.3 7.0 7.3  Total Bilirubin 0.0 - 1.2 mg/dL 0.3 0.4 0.7  Alkaline Phos 44 - 121 IU/L 93 99 86  AST 0 - 40 IU/L 31 22 21   ALT 0 - 32 IU/L 37(H) 18 24    Lipid Panel     Component Value Date/Time   CHOL 187 05/24/2020 0923   TRIG 269 (H) 05/24/2020 0923   HDL 42 05/24/2020 0923   CHOLHDL 4.5 (H) 05/24/2020 0923   CHOLHDL 4.0 08/01/2014 1118   VLDL 41 (H) 08/01/2014 1118   LDLCALC 99 05/24/2020 0923    CBC    Component Value Date/Time   WBC 11.6 (H) 05/14/2020 1646   WBC 10.5 04/10/2016 1435   RBC 4.41 05/14/2020 1646   RBC 4.84 04/10/2016 1435   HGB 11.5 05/14/2020 1646   HCT 35.5 05/14/2020 1646   PLT 407 05/14/2020 1646   MCV 81 05/14/2020 1646   MCH 26.1 (L) 05/14/2020 1646   MCH 24.2 (L) 04/10/2016 1435   MCHC 32.4 05/14/2020 1646   MCHC 32.0 04/10/2016 1435   RDW 13.1 05/14/2020 1646    Lab Results  Component Value Date   HGBA1C 5.6 05/14/2020    Assessment & Plan:  1. Weight gain Could be secondary to recent use of steroids Also SSRI could be contributory We have discussed a regular exercise regimen including avoiding late meals and reducing portion sizes  2. Blurry vision, bilateral Advise she will need to see an ophthalmologist optometrist for further evaluation Discussed available community resources since she has no insurance for referral - hydroxypropyl methylcellulose / hypromellose  (ISOPTO TEARS / GONIOVISC) 2.5 % ophthalmic solution; Place 1 drop into both eyes 4 (four) times daily as needed for dry eyes.  Dispense: 15 mL; Refill: 1   Meds ordered this encounter  Medications  . hydroxypropyl methylcellulose / hypromellose (ISOPTO TEARS / GONIOVISC) 2.5 % ophthalmic solution    Sig: Place 1 drop into both eyes 4 (four) times daily as needed for dry eyes.    Dispense:  15 mL    Refill:  1    Follow-up: Return for Keep previously scheduled appointment with PCP.       Hoy Register, MD, FAAFP. [facility] [place], [place] [phone]   07/20/2020, 5:17 PM

## 2020-07-20 NOTE — Progress Notes (Signed)
Patient has eaten today and is not currently taking any medications. Patient complains of blurry vision with burning in her eyes for the past 6 months. Patient shares that at night she sees double lines while driving and burning increases. Patient expresses being sensitive to the sun light and television causing burning. Patient began using [employer] 1.25

## 2020-07-23 ENCOUNTER — Encounter: Payer: Self-pay | Admitting: Physical Medicine and Rehabilitation

## 2020-07-23 ENCOUNTER — Other Ambulatory Visit: Payer: Self-pay

## 2020-07-23 ENCOUNTER — Ambulatory Visit (INDEPENDENT_AMBULATORY_CARE_PROVIDER_SITE_OTHER): Payer: Self-pay | Admitting: Physical Medicine and Rehabilitation

## 2020-07-23 DIAGNOSIS — R202 Paresthesia of skin: Secondary | ICD-10-CM

## 2020-07-23 MED FILL — LORATADINE 10 MG TABLET: 10 | 30 days supply | Qty: 30 | Fill #3

## 2020-07-23 NOTE — Progress Notes (Signed)
Julie Herman Julie Herman - 45 y.o. female MRN [id number]  Date of birth: 1976/06/26  Office Visit Note: Visit Date: 07/23/2020 PCP: Hoy Register, MD Referred by: Cain Saupe, MD  Subjective: Chief Complaint  Patient presents with  . Right Hand - Numbness  . Left Hand - Numbness   HPI:  Julie Herman is a 45 y.o. female who comes in today at the request of Dr. Glee Arvin for electrodiagnostic study of the Bilateral upper extremities.  Patient is Right hand dominant. She reports bilateral hand and arm pain for about several years with recent worsening the last 2 months.  She has been forced to quit her job due to constant pain and numbness that is worse at night and early morning.  She feels that she has no strength in her hands.  She has worn carpal tunnel braces with partial relief.  Denies any injuries.  She is here with the language interpreter today.   ROS Otherwise per HPI.  Assessment & Plan: Visit Diagnoses:    ICD-10-CM   1. 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   There is no significant electrodiagnostic evidence of any other focal nerve entrapment or polyneuropathy.   Recommendations: 1.  Follow-up with referring physician. 2.  Continue current management of symptoms. 3.  Continue use of resting splint at night-time and as needed during the day. 4.  Suggest surgical evaluation.  Meds & Orders: No orders of the defined types were placed in this encounter.   Orders Placed This Encounter  Procedures  . NCV with EMG (electromyography)    Follow-up: Return for Glee Arvin, MD.   Procedures: No procedures performed  EMG & NCV Findings: Evaluation of the left median motor and the right median motor nerves showed  prolonged distal onset latency (L5.2, R6.6 ms) and decreased conduction velocity (Elbow-Wrist, L45, R46 m/s).  The left median (across palm) sensory nerve showed no response (Palm) and prolonged distal peak latency (4.7 ms).  The right median (across palm) sensory nerve showed prolonged distal peak latency (Wrist, 6.6 ms) and prolonged distal peak latency (Palm, 3.8 ms).  All remaining nerves (as indicated in the following tables) were within normal limits.  Left vs. Right side comparison data for the median motor nerve indicates abnormal L-R latency difference (1.4 ms).  All remaining left vs. right side differences were within normal limits.    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   There is no significant electrodiagnostic evidence of any other focal nerve entrapment or polyneuropathy.   Recommendations: 1.  Follow-up with referring physician. 2.  Continue current management of symptoms. 3.  Continue use of resting splint at night-time and as needed during the day. 4.  Suggest surgical evaluation.  ___________________________ Naaman Plummer FAAPMR Board Certified, American Board of Physical Medicine and Rehabilitation    Nerve Conduction Studies Anti Sensory Summary Table   Stim Site NR Peak (ms) Norm Peak (ms) P-T Amp (V) Norm P-T Amp Site1 Site2 Delta-P (ms) Dist (cm) Vel (m/s) Norm Vel (m/s)  Left Median Acr Palm Anti Sensory (2nd Digit)  30.1C  Wrist    *4.7 <3.6 20.2 >10 Wrist Palm  0.0    Palm *NR  <2.0          Right Median Acr Palm Anti Sensory (2nd Digit)  29.8C  Wrist    *6.6 <3.6 18.1 >10 Wrist Palm 2.8 0.0    Palm    *3.8 <2.0 2.9         Left Radial Anti Sensory (Base 1st Digit)  30.6C  Wrist    2.0 <3.1 42.7  Wrist Base 1st Digit 2.0 0.0    Right Radial Anti Sensory (Base 1st Digit)  30.2C  Wrist     2.2 <3.1 27.2  Wrist Base 1st Digit 2.2 0.0    Left Ulnar Anti Sensory (5th Digit)  30.8C  Wrist    3.1 <3.7 31.0 >15.0 Wrist 5th Digit 3.1 14.0 45 >38  Right Ulnar Anti Sensory (5th Digit)  30.1C  Wrist    3.0 <3.7 41.8 >15.0 Wrist 5th Digit 3.0 14.0 47 >38   Motor Summary Table   Stim Site NR Onset (ms) Norm Onset (ms) O-P Amp (mV) Norm O-P Amp Site1 Site2 Delta-0 (ms) Dist (cm) Vel (m/s) Norm Vel (m/s)  Left Median Motor (Abd Poll Brev)  30.9C    martin-gruber  Wrist    *5.2 <4.2 7.4 >5 Elbow Wrist 4.0 18.0 *45 >50  Elbow    9.2  7.0         Right Median Motor (Abd Poll Brev)  30.4C    Martin-Gruber  Wrist    *6.6 <4.2 6.0 >5 Elbow Wrist 4.1 19.0 *46 >50  Elbow    10.7  5.1         Left Ulnar Motor (Abd Dig Min)  31C  Wrist    2.7 <4.2 12.7 >3 B Elbow Wrist 2.5 18.0 72 >53  B Elbow    5.2  11.3  A Elbow B Elbow 1.0 10.0 100 >53  A Elbow    6.2  11.2         Right Ulnar Motor (Abd Dig Min)  30.6C  Wrist    2.6 <4.2 11.5 >3 B Elbow Wrist 2.7 18.5 69 >53  B Elbow    5.3  10.7  A Elbow B Elbow 1.0 10.0 100 >53  A Elbow    6.3  10.6           Nerve Conduction Studies Anti Sensory Left/Right Comparison   Stim Site L Lat (ms) R Lat (ms) L-R Lat (ms) L Amp (V) R Amp (V) L-R Amp (%) Site1 Site2 L Vel (m/s) R Vel (m/s) L-R Vel (m/s)  Median Acr Palm Anti Sensory (2nd Digit)  30.1C  Wrist *4.7 *6.6 1.9 20.2 18.1 10.4 Wrist Palm     Palm  *3.8   2.9        Radial Anti Sensory (Base 1st Digit)  30.6C  Wrist 2.0 2.2 0.2 42.7 27.2 36.3 Wrist Base 1st Digit     Ulnar Anti Sensory (5th Digit)  30.8C  Wrist 3.1 3.0 0.1 31.0 41.8 25.8 Wrist 5th Digit 45 47 2   Motor Left/Right Comparison   Stim Site L Lat (ms) R Lat (ms) L-R Lat (ms) L Amp (mV) R Amp (mV) L-R Amp (%) Site1 Site2 L Vel (m/s) R Vel (m/s) L-R Vel (m/s)  Median Motor (Abd Poll Brev)  30.9C    martin-gruber  Wrist *5.2 *6.6 *1.4 7.4 6.0 18.9 Elbow Wrist *45 *46 1  Elbow 9.2 10.7 1.5 7.0 5.1 27.1  Ulnar Motor  (Abd Dig Min)  31C  Wrist 2.7 2.6 0.1 12.7 11.5 9.4 B Elbow Wrist 72 69 3  B Elbow 5.2 5.3 0.1 11.3 10.7 5.3 A Elbow B Elbow 100 100 0  A Elbow 6.2 6.3 0.1 11.2 10.6 5.4          Waveforms:                      Clinical History: No specialty comments available.     Objective:  VS:  HT:    WT:   BMI:     BP:   HR: bpm  TEMP: ( )  RESP:  Physical Exam Musculoskeletal:        General: No swelling, tenderness or deformity.     Comments: Inspection reveals no atrophy of the bilateral APB or FDI or hand intrinsics. There is no swelling, color changes, allodynia or dystrophic changes. There is 5 out of 5 strength in the bilateral wrist extension, finger abduction and long finger flexion. There is intact sensation to light touch in all dermatomal and peripheral nerve distributions.  There is a negative Hoffmann's test bilaterally.  Skin:    General: Skin is warm and dry.     Findings: No erythema or rash.  Neurological:     General: No focal deficit present.     Mental Status: She is alert and oriented to person, place, and time.     Motor: No weakness or abnormal muscle tone.     Coordination: Coordination normal.  Psychiatric:        Mood and Affect: Mood normal.        Behavior: Behavior normal.      Imaging: No results found.

## 2020-07-23 NOTE — Progress Notes (Signed)
Weakness, difficulty brushing teeth and hair. Numbness and tingling in fingers of both hands. Right hand dominant No lotions per patient

## 2020-08-02 NOTE — Procedures (Signed)
EMG & NCV Findings: Evaluation of the left median motor and the right median motor nerves showed prolonged distal onset latency (L5.2, R6.6 ms) and decreased conduction velocity (Elbow-Wrist, L45, R46 m/s).  The left median (across palm) sensory nerve showed no response (Palm) and prolonged distal peak latency (4.7 ms).  The right median (across palm) sensory nerve showed prolonged distal peak latency (Wrist, 6.6 ms) and prolonged distal peak latency (Palm, 3.8 ms).  All remaining nerves (as indicated in the following tables) were within normal limits.  Left vs. Right side comparison data for the median motor nerve indicates abnormal L-R latency difference (1.4 ms).  All remaining left vs. right side differences were within normal limits.    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   There is no significant electrodiagnostic evidence of any other focal nerve entrapment or polyneuropathy.   Recommendations: 1.  Follow-up with referring physician. 2.  Continue current management of symptoms. 3.  Continue use of resting splint at night-time and as needed during the day. 4.  Suggest surgical evaluation.  ___________________________ Naaman Plummer FAAPMR Board Certified, American Board of Physical Medicine and Rehabilitation    Nerve Conduction Studies Anti Sensory Summary Table   Stim Site NR Peak (ms) Norm Peak (ms) P-T Amp (V) Norm P-T Amp Site1 Site2 Delta-P (ms) Dist (cm) Vel (m/s) Norm Vel (m/s)  Left Median Acr Palm Anti Sensory (2nd Digit)  30.1C  Wrist    *4.7 <3.6 20.2 >10 Wrist Palm  0.0    Palm *NR  <2.0          Right Median Acr Palm Anti Sensory (2nd Digit)  29.8C  Wrist    *6.6 <3.6 18.1 >10 Wrist Palm 2.8 0.0    Palm    *3.8 <2.0 2.9         Left Radial Anti Sensory (Base 1st Digit)  30.6C  Wrist    2.0 <3.1 42.7   Wrist Base 1st Digit 2.0 0.0    Right Radial Anti Sensory (Base 1st Digit)  30.2C  Wrist    2.2 <3.1 27.2  Wrist Base 1st Digit 2.2 0.0    Left Ulnar Anti Sensory (5th Digit)  30.8C  Wrist    3.1 <3.7 31.0 >15.0 Wrist 5th Digit 3.1 14.0 45 >38  Right Ulnar Anti Sensory (5th Digit)  30.1C  Wrist    3.0 <3.7 41.8 >15.0 Wrist 5th Digit 3.0 14.0 47 >38   Motor Summary Table   Stim Site NR Onset (ms) Norm Onset (ms) O-P Amp (mV) Norm O-P Amp Site1 Site2 Delta-0 (ms) Dist (cm) Vel (m/s) Norm Vel (m/s)  Left Median Motor (Abd Poll Brev)  30.9C    martin-gruber  Wrist    *5.2 <4.2 7.4 >5 Elbow Wrist 4.0 18.0 *45 >50  Elbow    9.2  7.0         Right Median Motor (Abd Poll Brev)  30.4C    Martin-Gruber  Wrist    *6.6 <4.2 6.0 >5 Elbow Wrist 4.1 19.0 *46 >50  Elbow    10.7  5.1         Left Ulnar Motor (Abd Dig Min)  31C  Wrist    2.7 <4.2 12.7 >3 B Elbow Wrist 2.5 18.0 72 >53  B Elbow    5.2  11.3  A Elbow B Elbow 1.0 10.0 100 >53  A Elbow    6.2  11.2         Right Ulnar Motor (Abd Dig Min)  30.6C  Wrist    2.6 <4.2 11.5 >3 B Elbow Wrist 2.7 18.5 69 >53  B Elbow    5.3  10.7  A Elbow B Elbow 1.0 10.0 100 >53  A Elbow    6.3  10.6           Nerve Conduction Studies Anti Sensory Left/Right Comparison   Stim Site L Lat (ms) R Lat (ms) L-R Lat (ms) L Amp (V) R Amp (V) L-R Amp (%) Site1 Site2 L Vel (m/s) R Vel (m/s) L-R Vel (m/s)  Median Acr Palm Anti Sensory (2nd Digit)  30.1C  Wrist *4.7 *6.6 1.9 20.2 18.1 10.4 Wrist Palm     Palm  *3.8   2.9        Radial Anti Sensory (Base 1st Digit)  30.6C  Wrist 2.0 2.2 0.2 42.7 27.2 36.3 Wrist Base 1st Digit     Ulnar Anti Sensory (5th Digit)  30.8C  Wrist 3.1 3.0 0.1 31.0 41.8 25.8 Wrist 5th Digit 45 47 2   Motor Left/Right Comparison   Stim Site L Lat (ms) R Lat (ms) L-R Lat (ms) L Amp (mV) R Amp (mV) L-R Amp (%) Site1 Site2 L Vel (m/s) R Vel (m/s) L-R Vel (m/s)  Median Motor (Abd Poll Brev)  30.9C    martin-gruber  Wrist *5.2 *6.6  *1.4 7.4 6.0 18.9 Elbow Wrist *45 *46 1  Elbow 9.2 10.7 1.5 7.0 5.1 27.1       Ulnar Motor (Abd Dig Min)  31C  Wrist 2.7 2.6 0.1 12.7 11.5 9.4 B Elbow Wrist 72 69 3  B Elbow 5.2 5.3 0.1 11.3 10.7 5.3 A Elbow B Elbow 100 100 0  A Elbow 6.2 6.3 0.1 11.2 10.6 5.4          Waveforms:

## 2020-08-03 ENCOUNTER — Other Ambulatory Visit: Payer: Self-pay

## 2020-08-03 ENCOUNTER — Encounter: Payer: Self-pay | Admitting: Nurse Practitioner

## 2020-08-03 ENCOUNTER — Encounter: Payer: Self-pay | Admitting: Orthopaedic Surgery

## 2020-08-03 ENCOUNTER — Ambulatory Visit: Payer: Self-pay | Attending: Nurse Practitioner | Admitting: Nurse Practitioner

## 2020-08-03 ENCOUNTER — Ambulatory Visit (INDEPENDENT_AMBULATORY_CARE_PROVIDER_SITE_OTHER): Payer: Self-pay | Admitting: Orthopaedic Surgery

## 2020-08-03 ENCOUNTER — Other Ambulatory Visit: Payer: Self-pay | Admitting: Nurse Practitioner

## 2020-08-03 VITALS — BP 102/66 | HR 80 | Temp 98.5°F | Ht 61.81 in | Wt 165.0 lb

## 2020-08-03 DIAGNOSIS — S025XXD Fracture of tooth (traumatic), subsequent encounter for fracture with routine healing: Secondary | ICD-10-CM

## 2020-08-03 DIAGNOSIS — K0889 Other specified disorders of teeth and supporting structures: Secondary | ICD-10-CM

## 2020-08-03 DIAGNOSIS — G5601 Carpal tunnel syndrome, right upper limb: Secondary | ICD-10-CM

## 2020-08-03 MED ORDER — IBUPROFEN 800 MG PO TABS: 800.0000 mg | ORAL_TABLET | Freq: Three times a day (TID) | ORAL | 0 refills | Status: DC | PRN

## 2020-08-03 MED FILL — IBUPROFEN 800 MG TABLET: 800 | 20 days supply | Qty: 60 | Fill #0

## 2020-08-03 NOTE — Progress Notes (Signed)
Assessment & Plan:  Julie Herman was seen today for dental pain.  Diagnoses and all orders for this visit:  Pain, dental -     Ambulatory referral to Dentistry -     ibuprofen (ADVIL) 800 MG tablet; Take 1 tablet (800 mg total) by mouth every 8 (eight) hours as needed.  Closed fracture of tooth with routine healing, subsequent encounter -     Ambulatory referral to Dentistry -     ibuprofen (ADVIL) 800 MG tablet; Take 1 tablet (800 mg total) by mouth every 8 (eight) hours as needed.    Patient has been counseled on age-appropriate routine health concerns for screening and prevention. These are reviewed and up-to-date. Referrals have been placed accordingly. Immunizations are up-to-date or declined.    Subjective:   Chief Complaint  Patient presents with  . Dental Pain    Patient stated she's been having tooth pain due to the filling fell off two weeks ago.    HPI Julie Herman 45 y.o. female presents to office today with complaints of dental pain.   Dental Pain  She had a filling in the left back molar and the filling fell out 2 years ago. She never had it refilled. Notes pain in the area when she tries to eat certain foods. Denies any swelling, purulent drainage or facial pain.    Review of Systems  Constitutional: Negative for fever, malaise/fatigue and weight loss.  HENT: Negative for nosebleeds.        DENTAL PAIN  Eyes: Negative.  Negative for blurred vision, double vision and photophobia.  Respiratory: Negative.  Negative for cough and shortness of breath.   Cardiovascular: Negative.  Negative for chest pain, palpitations and leg swelling.  Gastrointestinal: Negative.  Negative for heartburn, nausea and vomiting.  Musculoskeletal: Negative.  Negative for myalgias.  Neurological: Negative.  Negative for dizziness, focal weakness, seizures and headaches.  Psychiatric/Behavioral: Negative.  Negative for suicidal ideas.    Past Medical History:  Diagnosis  Date  . Neuromuscular disorder [facility])     Past Surgical History:  Procedure Laterality Date  . NO PAST SURGERIES      Family History  Problem Relation Age of Onset  . Heart disease Mother   . Diabetes Brother   . Diabetes Maternal Aunt   . Diabetes Paternal Aunt     Social History Reviewed with no changes to be made today.   Outpatient Medications Prior to Visit  Medication Sig Dispense Refill  . omeprazole (PRILOSEC) 20 MG capsule Take 1 capsule (20 mg total) by mouth in the morning and at bedtime. To reduce stomach acid 60 capsule 3  . DULoxetine (CYMBALTA) 30 MG capsule Take 1 capsule (30 mg total) by mouth daily. (Patient not taking: Reported on 08/03/2020) 30 capsule 1  . famotidine (PEPCID) 20 MG tablet Take 1 tablet (20 mg total) by mouth at bedtime. (Patient not taking: Reported on 07/20/2020) 30 tablet 1  . hydroxypropyl methylcellulose / hypromellose (ISOPTO TEARS / GONIOVISC) 2.5 % ophthalmic solution Place 1 drop into both eyes 4 (four) times daily as needed for dry eyes. (Patient not taking: Reported on 08/03/2020) 15 mL 1   No facility-administered medications prior to visit.    No Known Allergies     Objective:    BP 102/66 (BP Location: Left Arm, Patient Position: Sitting, Cuff Size: Normal)   Pulse 80   Temp 98.5 F (36.9 C) (Oral)   Ht 5' 1.81" (1.57 m)   Wt 165  lb (74.8 kg)   LMP 07/28/2020   SpO2 97%   BMI 30.36 kg/m  Wt Readings from Last 3 Encounters:  08/03/20 165 lb (74.8 kg)  07/20/20 163 lb (73.9 kg)  06/17/20 163 lb (73.9 kg)    Physical Exam Vitals and nursing note reviewed.  Constitutional:      Appearance: She is well-developed and well-nourished.  HENT:     Head: Normocephalic and atraumatic.     Mouth/Throat:     Mouth: Mucous membranes are moist. No oral lesions.     Dentition: Abnormal dentition. Dental caries present. No dental tenderness, gingival swelling, dental abscesses or gum lesions.     Tongue: No lesions.     Pharynx:  Oropharynx is clear.  Eyes:     Extraocular Movements: EOM normal.  Cardiovascular:     Rate and Rhythm: Normal rate and regular rhythm.     Pulses: Intact distal pulses.     Heart sounds: Normal heart sounds. No murmur heard. No friction rub. No gallop.   Pulmonary:     Effort: Pulmonary effort is normal. No tachypnea or respiratory distress.     Breath sounds: Normal breath sounds. No decreased breath sounds, wheezing, rhonchi or rales.  Chest:     Chest wall: No tenderness.  Abdominal:     General: Bowel sounds are normal.     Palpations: Abdomen is soft.  Musculoskeletal:        General: No edema. Normal range of motion.     Cervical back: Normal range of motion.  Skin:    General: Skin is warm and dry.  Neurological:     Mental Status: She is alert and oriented to person, place, and time.     Coordination: Coordination normal.  Psychiatric:        Mood and Affect: Mood and affect normal.        Behavior: Behavior normal. Behavior is cooperative.        Thought Content: Thought content normal.        Judgment: Judgment normal.          Patient has been counseled extensively about nutrition and exercise as well as the importance of adherence with medications and regular follow-up. The patient was given clear instructions to go to ER or return to medical center if symptoms don't improve, worsen or new problems develop. The patient verbalized understanding.   Follow-up: Return for  PAP (Currently on menstrual cycle)    Claiborne Rigg, FNP-BC [facility] and [facility] [place], [place] [phone]   08/03/2020, 10:52 AM

## 2020-08-03 NOTE — Progress Notes (Signed)
   Office Visit Note   Patient: Julie Herman           Date of Birth: [birth year]           MRN: [id number] Visit Date: 08/03/2020              Requested by: Hoy Register, MD [address] [place],  [place] [address] PCP: Hoy Register, MD   Assessment & Plan: Visit Diagnoses:  1. Right carpal tunnel syndrome     Plan: Nerve conduction studies revealed moderate bilateral carpal tunnel syndrome.  Clinically her symptoms are worse in the right hand.  At this point given the fact that she has not received relief from conservative treatment in the form of bracing and rest and anti-inflammatories she has elected for right carpal tunnel release followed by left.  Risk benefits rehab recovery reviewed with the patient in detail.  Patient is not interested in cortisone injection as this is only temporary.  Today's encounter was made more complex by the language barrier.  Follow-Up Instructions: Return for 1 week postop visit.   Orders:  No orders of the defined types were placed in this encounter.  No orders of the defined types were placed in this encounter.     Procedures: No procedures performed   Clinical Data: No additional findings.   Subjective: Chief Complaint  Patient presents with  . Right Hand - Pain  . Left Hand - Pain    Julie Herman is a 45 year old female returns today for follow-up of nerve conduction studies to evaluate for carpal tunnel syndrome.  She has chronic constant and severe pain that affects daytime activities as well as sleeping.  She has not been unable to work as a result.     Review of Systems   Objective: Vital Signs: LMP 07/28/2020   Physical Exam  Ortho Exam Bilateral hand exams are unchanged. Specialty Comments:  No specialty comments available.  Imaging: No results found.   PMFS History: Patient Active Problem List   Diagnosis Date Noted  . Menorrhagia 04/10/2016  . Language barrier 09/14/2014  .  Hyperlipidemia 04/10/2014   Past Medical History:  Diagnosis Date  . Neuromuscular disorder (HCC)     Family History  Problem Relation Age of Onset  . Heart disease Mother   . Diabetes Brother   . Diabetes Maternal Aunt   . Diabetes Paternal Aunt     Past Surgical History:  Procedure Laterality Date  . NO PAST SURGERIES     Social History   Occupational History  . Not on file  Tobacco Use  . Smoking status: Never Smoker  . Smokeless tobacco: Never Used  Substance and Sexual Activity  . Alcohol use: Yes    Alcohol/week: 0.0 standard drinks  . Drug use: No  . Sexual activity: Yes    Birth control/protection: None

## 2020-08-05 ENCOUNTER — Telehealth: Payer: Self-pay | Admitting: Family Medicine

## 2020-08-05 NOTE — Telephone Encounter (Signed)
Per brenda spanish interpreter id [phone] pt had pfizer 1st dose on 10/14/2019 , 2nd dose on 11-14-2019 and pt is getting ready to have surgery and does have callback number [phone] to sch for the booster

## 2020-08-06 ENCOUNTER — Other Ambulatory Visit: Payer: Self-pay

## 2020-08-06 ENCOUNTER — Encounter (HOSPITAL_BASED_OUTPATIENT_CLINIC_OR_DEPARTMENT_OTHER): Payer: Self-pay | Admitting: Orthopaedic Surgery

## 2020-08-11 ENCOUNTER — Other Ambulatory Visit (HOSPITAL_COMMUNITY)
Admission: RE | Admit: 2020-08-11 | Discharge: 2020-08-11 | Disposition: A | Discharge status: Home / Self Care | Payer: Self-pay | Source: Ambulatory Visit | Attending: Orthopaedic Surgery | Admitting: Orthopaedic Surgery

## 2020-08-11 DIAGNOSIS — Z01812 Encounter for preprocedural laboratory examination: Secondary | ICD-10-CM | POA: Insufficient documentation

## 2020-08-11 DIAGNOSIS — Z20822 Contact with and (suspected) exposure to covid-19: Secondary | ICD-10-CM | POA: Insufficient documentation

## 2020-08-11 LAB — SARS CORONAVIRUS 2 (TAT 6-24 HRS): SARS Coronavirus 2: NEGATIVE

## 2020-08-13 ENCOUNTER — Encounter (HOSPITAL_BASED_OUTPATIENT_CLINIC_OR_DEPARTMENT_OTHER)
Admission: RE | Disposition: A | Discharge status: Home / Self Care | Payer: Self-pay | Source: Home / Self Care | Attending: Orthopaedic Surgery

## 2020-08-13 ENCOUNTER — Ambulatory Visit (HOSPITAL_BASED_OUTPATIENT_CLINIC_OR_DEPARTMENT_OTHER)
Admission: RE | Admit: 2020-08-13 | Discharge: 2020-08-13 | Disposition: A | Discharge status: Home / Self Care | Payer: Self-pay | Attending: Orthopaedic Surgery | Admitting: Orthopaedic Surgery

## 2020-08-13 ENCOUNTER — Other Ambulatory Visit: Payer: Self-pay

## 2020-08-13 ENCOUNTER — Ambulatory Visit (HOSPITAL_BASED_OUTPATIENT_CLINIC_OR_DEPARTMENT_OTHER): Payer: Self-pay | Admitting: Anesthesiology

## 2020-08-13 ENCOUNTER — Encounter (HOSPITAL_BASED_OUTPATIENT_CLINIC_OR_DEPARTMENT_OTHER): Payer: Self-pay | Admitting: Orthopaedic Surgery

## 2020-08-13 DIAGNOSIS — G5601 Carpal tunnel syndrome, right upper limb: Secondary | ICD-10-CM | POA: Insufficient documentation

## 2020-08-13 HISTORY — DX: Gastro-esophageal reflux disease without esophagitis: K21.9

## 2020-08-13 HISTORY — PX: CARPAL TUNNEL RELEASE: SHX101

## 2020-08-13 LAB — POCT PREGNANCY, URINE: Preg Test, Ur: NEGATIVE

## 2020-08-13 SURGERY — CARPAL TUNNEL RELEASE
Anesthesia: Regional | Site: Hand | Laterality: Right

## 2020-08-13 MED ORDER — FENTANYL CITRATE (PF) 100 MCG/2ML IJ SOLN
INTRAMUSCULAR | Status: AC
  Filled 2020-08-13: qty 2

## 2020-08-13 MED ORDER — LIDOCAINE 2% (20 MG/ML) 5 ML SYRINGE
INTRAMUSCULAR | Status: AC
  Filled 2020-08-13: qty 5

## 2020-08-13 MED ORDER — MIDAZOLAM HCL 2 MG/2ML IJ SOLN
INTRAMUSCULAR | Status: AC
  Filled 2020-08-13: qty 2

## 2020-08-13 MED ORDER — DIPHENHYDRAMINE HCL 50 MG/ML IJ SOLN
INTRAMUSCULAR | Status: AC
  Filled 2020-08-13: qty 1

## 2020-08-13 MED ORDER — FENTANYL CITRATE (PF) 100 MCG/2ML IJ SOLN
INTRAMUSCULAR | Status: DC | PRN
  Administered 2020-08-13 (×2): 50 ug via INTRAVENOUS

## 2020-08-13 MED ORDER — OXYCODONE HCL 5 MG/5ML PO SOLN: 5.0000 mg | Freq: Once | ORAL | Status: DC | PRN

## 2020-08-13 MED ORDER — EPHEDRINE 5 MG/ML INJ
INTRAVENOUS | Status: AC
  Filled 2020-08-13: qty 10

## 2020-08-13 MED ORDER — MIDAZOLAM HCL 5 MG/5ML IJ SOLN
INTRAMUSCULAR | Status: DC | PRN
  Administered 2020-08-13 (×2): 1 mg via INTRAVENOUS

## 2020-08-13 MED ORDER — BUPIVACAINE HCL (PF) 0.25 % IJ SOLN
INTRAMUSCULAR | Status: DC | PRN
  Administered 2020-08-13: 8.5 mL

## 2020-08-13 MED ORDER — ONDANSETRON HCL 4 MG/2ML IJ SOLN
INTRAMUSCULAR | Status: DC | PRN
  Administered 2020-08-13: 4 mg via INTRAVENOUS

## 2020-08-13 MED ORDER — PROPOFOL 500 MG/50ML IV EMUL
INTRAVENOUS | Status: DC | PRN
  Administered 2020-08-13: 100 ug/kg/min via INTRAVENOUS

## 2020-08-13 MED ORDER — CEFAZOLIN SODIUM-DEXTROSE 2-4 GM/100ML-% IV SOLN
INTRAVENOUS | Status: AC
  Filled 2020-08-13: qty 100

## 2020-08-13 MED ORDER — HYDROCODONE-ACETAMINOPHEN 5-325 MG PO TABS: 1.0000 | ORAL_TABLET | Freq: Four times a day (QID) | ORAL | 0 refills | Status: DC | PRN

## 2020-08-13 MED ORDER — PROMETHAZINE HCL 25 MG/ML IJ SOLN: 6.2500 mg | INTRAMUSCULAR | Status: DC | PRN

## 2020-08-13 MED ORDER — SUCCINYLCHOLINE CHLORIDE 200 MG/10ML IV SOSY
PREFILLED_SYRINGE | INTRAVENOUS | Status: AC
  Filled 2020-08-13: qty 10

## 2020-08-13 MED ORDER — LACTATED RINGERS IV SOLN: INTRAVENOUS | Status: DC

## 2020-08-13 MED ORDER — DIPHENHYDRAMINE HCL 50 MG/ML IJ SOLN
INTRAMUSCULAR | Status: DC | PRN
  Administered 2020-08-13: 6.25 mg via INTRAVENOUS

## 2020-08-13 MED ORDER — LIDOCAINE HCL (PF) 0.5 % IJ SOLN
INTRAMUSCULAR | Status: DC | PRN
  Administered 2020-08-13: 30 mL via INTRAVENOUS

## 2020-08-13 MED ORDER — OXYCODONE HCL 5 MG PO TABS
5.0000 mg | ORAL_TABLET | Freq: Once | ORAL | Status: DC | PRN
Start: 2020-08-13 — End: 2020-08-13

## 2020-08-13 MED ORDER — CEFAZOLIN SODIUM-DEXTROSE 2-4 GM/100ML-% IV SOLN
2.0000 g | INTRAVENOUS | Status: AC
  Administered 2020-08-13: 2 g via INTRAVENOUS

## 2020-08-13 MED ORDER — PHENYLEPHRINE 40 MCG/ML (10ML) SYRINGE FOR IV PUSH (FOR BLOOD PRESSURE SUPPORT)
PREFILLED_SYRINGE | INTRAVENOUS | Status: AC
  Filled 2020-08-13: qty 10

## 2020-08-13 MED ORDER — HYDROMORPHONE HCL 1 MG/ML IJ SOLN
0.2500 mg | INTRAMUSCULAR | Status: DC | PRN
Start: 2020-08-13 — End: 2020-08-13

## 2020-08-13 MED ORDER — ONDANSETRON HCL 4 MG/2ML IJ SOLN
INTRAMUSCULAR | Status: AC
  Filled 2020-08-13: qty 2

## 2020-08-13 SURGICAL SUPPLY — 50 items
BAND INSRT 18 STRL LF DISP RB (MISCELLANEOUS) ×2
BAND RUBBER #18 3X1/16 STRL (MISCELLANEOUS) ×4 IMPLANT
BLADE MINI RND TIP GREEN BEAV (BLADE) ×2 IMPLANT
BLADE SURG 15 STRL LF DISP TIS (BLADE) ×1 IMPLANT
BLADE SURG 15 STRL SS (BLADE) ×2
BNDG CMPR 9X4 STRL LF SNTH (GAUZE/BANDAGES/DRESSINGS)
BNDG ELASTIC 3X5.8 VLCR STR LF (GAUZE/BANDAGES/DRESSINGS) ×2 IMPLANT
BNDG ESMARK 4X9 LF (GAUZE/BANDAGES/DRESSINGS) ×1 IMPLANT
BNDG PLASTER X FAST 3X3 WHT LF (CAST SUPPLIES) IMPLANT
BNDG PLSTR 9X3 FST ST WHT (CAST SUPPLIES)
BRUSH SCRUB EZ PLAIN DRY (MISCELLANEOUS) ×2 IMPLANT
CANISTER SUCT 1200ML W/VALVE (MISCELLANEOUS) ×1 IMPLANT
CORD BIPOLAR FORCEPS 12FT (ELECTRODE) ×2 IMPLANT
COVER BACK TABLE 60X90IN (DRAPES) ×2 IMPLANT
COVER MAYO STAND STRL (DRAPES) ×2 IMPLANT
COVER WAND RF STERILE (DRAPES) IMPLANT
CUFF TOURN SGL QUICK 18X4 (TOURNIQUET CUFF) ×1 IMPLANT
DECANTER SPIKE VIAL GLASS SM (MISCELLANEOUS) IMPLANT
DRAPE EXTREMITY T 121X128X90 (DISPOSABLE) ×2 IMPLANT
DRAPE IMP U-DRAPE 54X76 (DRAPES) ×2 IMPLANT
DRAPE SURG 17X23 STRL (DRAPES) ×2 IMPLANT
GAUZE 4X4 16PLY RFD (DISPOSABLE) IMPLANT
GAUZE SPONGE 4X4 12PLY STRL (GAUZE/BANDAGES/DRESSINGS) ×2 IMPLANT
GAUZE XEROFORM 1X8 LF (GAUZE/BANDAGES/DRESSINGS) ×2 IMPLANT
GLOVE SURG LTX SZ7 (GLOVE) ×2 IMPLANT
GLOVE SURG NEOP MICRO LF SZ7.5 (GLOVE) ×2 IMPLANT
GLOVE SURG SYN 7.5  E (GLOVE) ×2
GLOVE SURG SYN 7.5 E (GLOVE) ×1 IMPLANT
GLOVE SURG SYN 7.5 PF PI (GLOVE) ×1 IMPLANT
GLOVE SURG UNDER POLY LF SZ7 (GLOVE) ×2 IMPLANT
GOWN STRL REIN XL XLG (GOWN DISPOSABLE) ×3 IMPLANT
GOWN STRL REUS W/ TWL LRG LVL3 (GOWN DISPOSABLE) IMPLANT
GOWN STRL REUS W/ TWL XL LVL3 (GOWN DISPOSABLE) ×2 IMPLANT
GOWN STRL REUS W/TWL LRG LVL3 (GOWN DISPOSABLE) ×2
GOWN STRL REUS W/TWL XL LVL3 (GOWN DISPOSABLE) ×2
NDL HYPO 25X1 1.5 SAFETY (NEEDLE) IMPLANT
NEEDLE HYPO 25X1 1.5 SAFETY (NEEDLE) ×2 IMPLANT
NS IRRIG 1000ML POUR BTL (IV SOLUTION) ×2 IMPLANT
PACK BASIN DAY SURGERY FS (CUSTOM PROCEDURE TRAY) ×2 IMPLANT
PAD CAST 3X4 CTTN HI CHSV (CAST SUPPLIES) ×1 IMPLANT
PADDING CAST COTTON 3X4 STRL (CAST SUPPLIES) ×2
SHEET MEDIUM DRAPE 40X70 STRL (DRAPES) ×2 IMPLANT
STOCKINETTE 4X48 STRL (DRAPES) ×2 IMPLANT
SUT ETHILON 3 0 PS 1 (SUTURE) ×2 IMPLANT
SYR BULB EAR ULCER 3OZ GRN STR (SYRINGE) ×2 IMPLANT
SYR CONTROL 10ML LL (SYRINGE) ×1 IMPLANT
TOWEL GREEN STERILE FF (TOWEL DISPOSABLE) ×2 IMPLANT
TRAY DSU PREP LF (CUSTOM PROCEDURE TRAY) ×2 IMPLANT
TUBE CONNECTING 20X1/4 (TUBING) IMPLANT
UNDERPAD 30X36 HEAVY ABSORB (UNDERPADS AND DIAPERS) ×2 IMPLANT

## 2020-08-13 NOTE — Discharge Instructions (Signed)

## 2020-08-13 NOTE — Transfer of Care (Signed)
Immediate Anesthesia Transfer of Care Note  Patient: Julie Herman  Procedure(s) Performed: RIGHT CARPAL TUNNEL RELEASE (Right Hand)  Patient Location: PACU  Anesthesia Type:MAC and Bier block  Level of Consciousness: sedated  Airway & Oxygen Therapy: Patient Spontanous Breathing and Patient connected to face mask oxygen  Post-op Assessment: Report given to RN and Post -op Vital signs reviewed and stable  Post vital signs: Reviewed and stable  Last Vitals:  Vitals Value Taken Time  BP    Temp    Pulse    Resp    SpO2      Last Pain:  Vitals:   08/13/20 1040  TempSrc: Oral  PainSc: 0-No pain      Patients Stated Pain Goal: 9 (84/12/82 0813)  Complications: No complications documented.

## 2020-08-13 NOTE — H&P (Signed)
    PREOPERATIVE H&P  Chief Complaint: right carpal tunnel syndrome  HPI: Julie Herman is a 45 y.o. female who presents for surgical treatment of right carpal tunnel syndrome.  She denies any changes in medical history.  Past Medical History:  Diagnosis Date  . GERD (gastroesophageal reflux disease)   . Neuromuscular disorder [facility])    Past Surgical History:  Procedure Laterality Date  . NO PAST SURGERIES     Social History   Socioeconomic History  . Marital status: Single    Spouse name: Not on file  . Number of children: Not on file  . Years of education: Not on file  . Highest education level: Not on file  Occupational History  . Not on file  Tobacco Use  . Smoking status: Never Smoker  . Smokeless tobacco: Never Used  Substance and Sexual Activity  . Alcohol use: Yes    Alcohol/week: 0.0 standard drinks  . Drug use: No  . Sexual activity: Yes    Birth control/protection: None  Other Topics Concern  . Not on file  Social History Narrative  . Not on file   Social Determinants of Health   Financial Resource Strain: Not on file  Food Insecurity: Not on file  Transportation Needs: Not on file  Physical Activity: Not on file  Stress: Not on file  Social Connections: Not on file   Family History  Problem Relation Age of Onset  . Heart disease Mother   . Diabetes Brother   . Diabetes Maternal Aunt   . Diabetes Paternal Aunt    No Known Allergies Prior to Admission medications   Medication Sig Start Date End Date Taking? Authorizing Provider  loratadine (CLARITIN) 10 MG tablet Take 10 mg by mouth daily.   Yes [provider]  omeprazole (PRILOSEC) 20 MG capsule Take 1 capsule (20 mg total) by mouth in the morning and at bedtime. To reduce stomach acid 05/14/20  Yes Claiborne Rigg, NP     Positive ROS: All other systems have been reviewed and were otherwise negative with the exception of those mentioned in the HPI and as  above.  Physical Exam: General: Alert, no acute distress Cardiovascular: No pedal edema Respiratory: No cyanosis, no use of accessory musculature GI: abdomen soft Skin: No lesions in the area of chief complaint Neurologic: Sensation intact distally Psychiatric: Patient is competent for consent with normal mood and affect Lymphatic: no lymphedema  MUSCULOSKELETAL: exam stable  Assessment: right carpal tunnel syndrome  Plan: Plan for Procedure(s): RIGHT CARPAL TUNNEL RELEASE  The risks benefits and alternatives were discussed with the patient including but not limited to the risks of nonoperative treatment, versus surgical intervention including infection, bleeding, nerve injury,  blood clots, cardiopulmonary complications, morbidity, mortality, among others, and they were willing to proceed.   Preoperative templating of the joint replacement has been completed, documented, and submitted to the Operating Room personnel in order to optimize intra-operative equipment management.   Glee Arvin, MD 08/13/2020 10:54 AM

## 2020-08-13 NOTE — Op Note (Signed)
   Carpal tunnel op note  DATE OF SURGERY:08/13/2020  PREOPERATIVE DIAGNOSIS:  Right carpal tunnel syndrome  POSTOPERATIVE DIAGNOSIS: same  PROCEDURE:  Right carpal tunnel release. CPT 00762  SURGEON: Surgeon(s): Tarry Kos, MD  ASSIST: Oneal Grout, PA-C  ANESTHESIA:  Bier block, local  TOURNIQUET TIME: less than 20 minutes  BLOOD LOSS: Minimal.  COMPLICATIONS: None.  PATHOLOGY: None.  INDICATIONS: The patient is a 45 y.o. -year-old female who presented with carpal tunnel syndrome failing nonsurgical management, indicated for surgical release.  DESCRIPTION OF PROCEDURE: The patient was identified in the preoperative holding area.  The operative site was marked by the surgeon and confirmed by the patient.  He was brought back to the operating room.  Anesthesia was induced by the anesthesia team.  A well padded nonsterile tourniquet was placed. The operative extremity was prepped and draped in standard sterile fashion.  A timeout was performed.  Preoperative antibiotics were given.   A palmar incision was made about 5 mm ulnar to the thenar crease.  The palmar aponeurosis was exposed and divided in line with the skin incision. The palmaris brevis was visualized and divided.  The distal edge of the transcarpal ligament was identified. A hemostat was inserted into the carpal tunnel to protect the median nerve and the flexor tendons. Then, the transverse carpal ligament was released under direct visualization. Proximally, a subcutaneous tunnel was made allowing a Sewell retractor to be placed. Then, the distal portion of the antebrachial fascia was released. Distally, all fibrous bands were released. The median nerve was visualized, and the fat pad was exposed. Following release, local infiltration with 0.25% of Sensorcaine was given. The tourniquet was deflated. Hemostasis achieved.  Wound was irrigated and closed with 4-0 nylon sutures. Sterile dressing applied. The patient was  transferred to the recovery room in stable condition after all counts were correct.  Tessa Lerner was critical for opening, closing, retracting, limb positioning and overall facilitation and completion of the surgery.  POSTOPERATIVE PLAN: To start nerve gliding exercises as tolerated and no heavy lifting for four weeks.  Glee Arvin, M.D. [facility] 12:13 PM

## 2020-08-13 NOTE — Anesthesia Postprocedure Evaluation (Signed)
Anesthesia Post Note  Patient: Julie Herman  Procedure(s) Performed: RIGHT CARPAL TUNNEL RELEASE (Right Hand)     Patient location during evaluation: PACU Anesthesia Type: Bier Block Level of consciousness: awake and alert Pain management: pain level controlled Vital Signs Assessment: post-procedure vital signs reviewed and stable Respiratory status: spontaneous breathing, nonlabored ventilation and respiratory function stable Cardiovascular status: blood pressure returned to baseline and stable Postop Assessment: no apparent nausea or vomiting Anesthetic complications: no   No complications documented.  Last Vitals:  Vitals:   08/13/20 1245 08/13/20 1300  BP: (!) 142/72 (!) 126/53  Pulse: 86 77  Resp: 17 14  Temp:    SpO2: 100% 100%    Last Pain:  Vitals:   08/13/20 1300  TempSrc:   PainSc: 0-No pain                 Lowella Curb

## 2020-08-13 NOTE — Anesthesia Preprocedure Evaluation (Signed)
Anesthesia Evaluation  Patient identified by MRN, date of birth, ID band Patient awake    Reviewed: Allergy & Precautions, H&P , NPO status , Patient's Chart, lab work & pertinent test results  Airway Mallampati: II  TM Distance: >3 FB Neck ROM: Full    Dental no notable dental hx.    Pulmonary neg pulmonary ROS,    Pulmonary exam normal breath sounds clear to auscultation       Cardiovascular negative cardio ROS Normal cardiovascular exam Rhythm:Regular Rate:Normal     Neuro/Psych negative neurological ROS  negative psych ROS   GI/Hepatic Neg liver ROS, GERD  ,  Endo/Other  negative endocrine ROS  Renal/GU negative Renal ROS  negative genitourinary   Musculoskeletal negative musculoskeletal ROS (+)   Abdominal (+) + obese,   Peds negative pediatric ROS (+)  Hematology negative hematology ROS (+)   Anesthesia Other Findings   Reproductive/Obstetrics negative OB ROS                             Anesthesia Physical Anesthesia Plan  ASA: II  Anesthesia Plan: Bier Block and Bier Block-LIDOCAINE ONLY   Post-op Pain Management:    Induction: Intravenous  PONV Risk Score and Plan: 2 and Ondansetron, Midazolam and Treatment may vary due to age or medical condition  Airway Management Planned: Simple Face Mask  Additional Equipment:   Intra-op Plan:   Post-operative Plan:   Informed Consent: I have reviewed the patients History and Physical, chart, labs and discussed the procedure including the risks, benefits and alternatives for the proposed anesthesia with the patient or authorized representative who has indicated his/her understanding and acceptance.     Dental advisory given  Plan Discussed with: CRNA  Anesthesia Plan Comments:         Anesthesia Quick Evaluation

## 2020-08-16 ENCOUNTER — Encounter (HOSPITAL_BASED_OUTPATIENT_CLINIC_OR_DEPARTMENT_OTHER): Payer: Self-pay | Admitting: Orthopaedic Surgery

## 2020-08-16 NOTE — Addendum Note (Signed)
Addendum  created 08/16/20 0910 by Blocker, Jewel Baize, CRNA   Charge Capture section accepted, Visit diagnoses modified

## 2020-08-17 ENCOUNTER — Other Ambulatory Visit: Payer: Self-pay

## 2020-08-17 ENCOUNTER — Encounter: Payer: Self-pay | Admitting: Internal Medicine

## 2020-08-17 ENCOUNTER — Ambulatory Visit: Payer: Self-pay | Attending: Internal Medicine | Admitting: Internal Medicine

## 2020-08-17 VITALS — BP 117/71 | HR 80 | Resp 16 | Wt 168.4 lb

## 2020-08-17 DIAGNOSIS — Z124 Encounter for screening for malignant neoplasm of cervix: Secondary | ICD-10-CM

## 2020-08-17 DIAGNOSIS — Z1231 Encounter for screening mammogram for malignant neoplasm of breast: Secondary | ICD-10-CM

## 2020-08-17 NOTE — Progress Notes (Signed)
Patient ID: Julie Herman, female    DOB: 1976/02/11  MRN: [id number]  CC: Gynecologic Exam   Subjective: Julie Herman is a 45 y.o. female who presents for pap.  PCP is Dr. Alvis Lemmings Her concerns today include:  Pt with hx of HL, CTS  Pt is G7P4 (3 miscarriges) Last pap was 6 yrs ago and was nl LNMP was 07/28/2020.  Menses regular and last 7 days No abnormal vaginal discgh or itching at this time Sexually active with one female partner.  Not on any method of BC.  Last MMG was 2019.  Some pain in LT breast last wk. No fhx of breast CA.  Aunt with ovarian and uterine cancer Patient Active Problem List   Diagnosis Date Noted  . Carpal tunnel syndrome on right 08/13/2020  . Menorrhagia 04/10/2016  . Language barrier 09/14/2014  . Hyperlipidemia 04/10/2014     Current Outpatient Medications on File Prior to Visit  Medication Sig Dispense Refill  . HYDROcodone-acetaminophen (NORCO) 5-325 MG tablet Take 1 tablet by mouth every 6 (six) hours as needed. 20 tablet 0  . loratadine (CLARITIN) 10 MG tablet Take 10 mg by mouth daily.    Marland Kitchen omeprazole (PRILOSEC) 20 MG capsule Take 1 capsule (20 mg total) by mouth in the morning and at bedtime. To reduce stomach acid 60 capsule 3   No current facility-administered medications on file prior to visit.    No Known Allergies  Social History   Socioeconomic History  . Marital status: Single    Spouse name: Not on file  . Number of children: Not on file  . Years of education: Not on file  . Highest education level: Not on file  Occupational History  . Not on file  Tobacco Use  . Smoking status: Never Smoker  . Smokeless tobacco: Never Used  Substance and Sexual Activity  . Alcohol use: Yes    Alcohol/week: 0.0 standard drinks  . Drug use: No  . Sexual activity: Yes    Birth control/protection: None  Other Topics Concern  . Not on file  Social History Narrative  . Not on file   Social Determinants of  Health   Financial Resource Strain: Not on file  Food Insecurity: Not on file  Transportation Needs: Not on file  Physical Activity: Not on file  Stress: Not on file  Social Connections: Not on file  Intimate Partner Violence: Not on file    Family History  Problem Relation Age of Onset  . Heart disease Mother   . Diabetes Brother   . Diabetes Maternal Aunt   . Diabetes Paternal Aunt     Past Surgical History:  Procedure Laterality Date  . CARPAL TUNNEL RELEASE Right 08/13/2020   Procedure: RIGHT CARPAL TUNNEL RELEASE;  Surgeon: Tarry Kos, MD;  Location: [facility];  Service: Orthopedics;  Laterality: Right;  Bier block  . NO PAST SURGERIES      ROS: Review of Systems Negative except as stated above  PHYSICAL EXAM: BP 117/71   Pulse 80   Resp 16   Wt 168 lb 6.4 oz (76.4 kg)   LMP 07/28/2020 Comment: UPT neg DOS  SpO2 99%   BMI 32.89 kg/m   Physical Exam My CMA Ms. Julius Bowels is present for breast and pelvic exam. General appearance - alert, well appearing, and in no distress Mental status - normal mood, behavior, speech, dress, motor activity, and thought processes Breasts - breasts appear normal,  no suspicious masses, no skin or nipple changes or axillary nodes Pelvic - normal external genitalia, vulva, vagina, cervix, uterus and adnexa   CMP Latest Ref Rng & Units 05/14/2020 11/16/2014 08/01/2014  Glucose 65 - 99 mg/dL 94 95(K) 78  BUN 6 - 24 mg/dL 9 7 9   Creatinine 0.57 - 1.00 mg/dL ) 9.32(I 7.12  Sodium 134 - 144 mmol/L 137 139 136  Potassium 3.5 - 5.2 mmol/L 4.3 3.7 4.4  Chloride 96 - 106 mmol/L 101 104 103  CO2 20 - 29 mmol/L 23 24 24   Calcium 8.7 - 10.2 mg/dL 9.7 8.9 9.3  Total Protein 6.0 - 8.5 g/dL 7.3 7.0 7.3  Total Bilirubin 0.0 - 1.2 mg/dL 0.3 0.4 0.7  Alkaline Phos 44 - 121 IU/L 93 99 86  AST 0 - 40 IU/L 31 22 21   ALT 0 - 32 IU/L 37(H) 18 24   Lipid Panel     Component Value Date/Time   CHOL 187 05/24/2020 0923   TRIG 269  (H) 05/24/2020 0923   HDL 42 05/24/2020 0923   CHOLHDL 4.5 (H) 05/24/2020 0923   CHOLHDL 4.0 08/01/2014 1118   VLDL 41 (H) 08/01/2014 1118   LDLCALC 99 05/24/2020 0923    CBC    Component Value Date/Time   WBC 11.6 (H) 05/14/2020 1646   WBC 10.5 04/10/2016 1435   RBC 4.41 05/14/2020 1646   RBC 4.84 04/10/2016 1435   HGB 11.5 05/14/2020 1646   HCT 35.5 05/14/2020 1646   PLT 407 05/14/2020 1646   MCV 81 05/14/2020 1646   MCH 26.1 (L) 05/14/2020 1646   MCH 24.2 (L) 04/10/2016 1435   MCHC 32.4 05/14/2020 1646   MCHC 32.0 04/10/2016 1435   RDW 13.1 05/14/2020 1646    ASSESSMENT AND PLAN: 1. Pap smear for cervical cancer screening - Cytology - PAP - Cervicovaginal ancillary only  2. Encounter for screening mammogram for malignant neoplasm of breast - MM Digital Screening; Future   Patient was given the opportunity to ask questions.  Patient verbalized understanding of the plan and was able to repeat key elements of the plan.  [employer] interpreter used during this encounter. 13/06/2020 06/10/2016  Orders Placed This Encounter  Procedures  . MM Digital Screening     Requested Prescriptions    No prescriptions requested or ordered in this encounter    No follow-ups on file.  13/06/2020, MD, FACP

## 2020-08-19 ENCOUNTER — Other Ambulatory Visit: Payer: Self-pay | Admitting: Internal Medicine

## 2020-08-19 LAB — CERVICOVAGINAL ANCILLARY ONLY
Bacterial Vaginitis (gardnerella): NEGATIVE
Candida Glabrata: NEGATIVE
Candida Vaginitis: POSITIVE — AB
Chlamydia: NEGATIVE
Comment: NEGATIVE
Comment: NEGATIVE
Comment: NEGATIVE
Comment: NEGATIVE
Comment: NEGATIVE
Comment: NORMAL
Neisseria Gonorrhea: NEGATIVE
Trichomonas: NEGATIVE

## 2020-08-19 LAB — CYTOLOGY - PAP
Comment: NEGATIVE
Diagnosis: NEGATIVE
High risk HPV: NEGATIVE

## 2020-08-19 MED ORDER — FLUCONAZOLE 150 MG PO TABS: 150.0000 mg | ORAL_TABLET | Freq: Once | ORAL | 0 refills | Status: DC

## 2020-08-20 ENCOUNTER — Other Ambulatory Visit: Payer: Self-pay

## 2020-08-20 ENCOUNTER — Ambulatory Visit (INDEPENDENT_AMBULATORY_CARE_PROVIDER_SITE_OTHER): Payer: Self-pay | Admitting: Orthopaedic Surgery

## 2020-08-20 DIAGNOSIS — G5601 Carpal tunnel syndrome, right upper limb: Secondary | ICD-10-CM

## 2020-08-20 MED FILL — FLUCONAZOLE 150 MG TABLET: 150 | 1 days supply | Qty: 1 | Fill #0

## 2020-08-20 NOTE — Progress Notes (Signed)
   Post-Op Visit Note   Patient: Julie Herman           Date of Birth: 1976/04/22           MRN: [id number] Visit Date: 08/20/2020 PCP: Hoy Register, MD   Assessment & Plan:  Chief Complaint:  Chief Complaint  Patient presents with  . Right Hand - Routine Post Op   Visit Diagnoses:  1. Right carpal tunnel syndrome     Plan:   Julie Herman is 1 week status post right carpal tunnel release.  Overall doing well.  Has taken a couple of hydrocodone but mainly takes ibuprofen.  She denies any pain.  She was afraid to move her fingers.  Surgical incision is intact.  No signs of infection.  Mild bruising.  No neurovascular compromise.  Bandage was removed and a Band-Aid was applied.  I demonstrated tendon gliding exercises today.  Activity restrictions were reviewed.  Reassurance was provided that it is okay to use her hand gently.  Follow-up next week for suture removal.  Follow-Up Instructions: Return in about 1 week (around 08/27/2020).   Orders:  No orders of the defined types were placed in this encounter.  No orders of the defined types were placed in this encounter.   Imaging: No results found.  PMFS History: Patient Active Problem List   Diagnosis Date Noted  . Right carpal tunnel syndrome 08/20/2020  . Carpal tunnel syndrome on right 08/13/2020  . Menorrhagia 04/10/2016  . Language barrier 09/14/2014  . Hyperlipidemia 04/10/2014   Past Medical History:  Diagnosis Date  . GERD (gastroesophageal reflux disease)   . Neuromuscular disorder (HCC)     Family History  Problem Relation Age of Onset  . Heart disease Mother   . Diabetes Brother   . Diabetes Maternal Aunt   . Diabetes Paternal Aunt     Past Surgical History:  Procedure Laterality Date  . CARPAL TUNNEL RELEASE Right 08/13/2020   Procedure: RIGHT CARPAL TUNNEL RELEASE;  Surgeon: Tarry Kos, MD;  Location: [facility];  Service: Orthopedics;  Laterality: Right;  Bier block   . NO PAST SURGERIES     Social History   Occupational History  . Not on file  Tobacco Use  . Smoking status: Never Smoker  . Smokeless tobacco: Never Used  Substance and Sexual Activity  . Alcohol use: Yes    Alcohol/week: 0.0 standard drinks  . Drug use: No  . Sexual activity: Yes    Birth control/protection: None

## 2020-08-27 ENCOUNTER — Encounter: Payer: Self-pay | Admitting: Orthopaedic Surgery

## 2020-08-27 ENCOUNTER — Ambulatory Visit (INDEPENDENT_AMBULATORY_CARE_PROVIDER_SITE_OTHER): Payer: Self-pay | Admitting: Physician Assistant

## 2020-08-27 DIAGNOSIS — Z9889 Other specified postprocedural states: Secondary | ICD-10-CM

## 2020-08-27 DIAGNOSIS — G5601 Carpal tunnel syndrome, right upper limb: Secondary | ICD-10-CM

## 2020-08-27 NOTE — Progress Notes (Signed)
   Post-Op Visit Note   Patient: Julie Herman           Date of Birth: 1975/09/06           MRN: [id number] Visit Date: 08/27/2020 PCP: Hoy Register, MD   Assessment & Plan:  Chief Complaint:  Chief Complaint  Patient presents with  . Right Hand - Routine Post Op, Pain   Visit Diagnoses:  1. Right carpal tunnel syndrome   2. S/P carpal tunnel release     Plan: Patient is a pleasant 45 year old Spanish-speaking female who is here today with an interpreter.  She is 2 weeks out right carpal tunnel release.  She has been doing well.  She has slight soreness to the incision but nothing more.  No numbness, tingling or burning.  Examination of her right hand reveals a well healed surgical incision without complication.  No evidence of infection or cellulitis.  Fingers are warm well perfused.  Today, sutures were removed and Steri-Strips applied.  No heavy lifting or submerging her hand in water for another 2 weeks.  Follow-up with [place] in 4 weeks time for repeat evaluation and to discuss left carpal tunnel release.  Follow-Up Instructions: Return in about 4 weeks (around 09/24/2020).   Orders:  No orders of the defined types were placed in this encounter.  No orders of the defined types were placed in this encounter.   Imaging: No new imaging  PMFS History: Patient Active Problem List   Diagnosis Date Noted  . Right carpal tunnel syndrome 08/20/2020  . Carpal tunnel syndrome on right 08/13/2020  . Menorrhagia 04/10/2016  . Language barrier 09/14/2014  . Hyperlipidemia 04/10/2014   Past Medical History:  Diagnosis Date  . GERD (gastroesophageal reflux disease)   . Neuromuscular disorder (HCC)     Family History  Problem Relation Age of Onset  . Heart disease Mother   . Diabetes Brother   . Diabetes Maternal Aunt   . Diabetes Paternal Aunt     Past Surgical History:  Procedure Laterality Date  . CARPAL TUNNEL RELEASE Right 08/13/2020   Procedure: RIGHT  CARPAL TUNNEL RELEASE;  Surgeon: Tarry Kos, MD;  Location: [facility];  Service: Orthopedics;  Laterality: Right;  Bier block  . NO PAST SURGERIES     Social History   Occupational History  . Not on file  Tobacco Use  . Smoking status: Never Smoker  . Smokeless tobacco: Never Used  Substance and Sexual Activity  . Alcohol use: Yes    Alcohol/week: 0.0 standard drinks  . Drug use: No  . Sexual activity: Yes    Birth control/protection: None

## 2020-09-10 ENCOUNTER — Ambulatory Visit: Payer: Self-pay | Attending: Family Medicine

## 2020-09-10 ENCOUNTER — Other Ambulatory Visit: Payer: Self-pay

## 2020-09-24 ENCOUNTER — Encounter: Payer: Self-pay | Admitting: Orthopaedic Surgery

## 2020-09-24 ENCOUNTER — Ambulatory Visit (INDEPENDENT_AMBULATORY_CARE_PROVIDER_SITE_OTHER): Payer: Self-pay | Admitting: Physician Assistant

## 2020-09-24 ENCOUNTER — Other Ambulatory Visit: Payer: Self-pay | Admitting: Physician Assistant

## 2020-09-24 VITALS — Ht 60.0 in | Wt 168.0 lb

## 2020-09-24 DIAGNOSIS — G5601 Carpal tunnel syndrome, right upper limb: Secondary | ICD-10-CM

## 2020-09-24 MED ORDER — MELOXICAM 7.5 MG PO TABS: 7.5000 mg | ORAL_TABLET | Freq: Every day | ORAL | 2 refills | Status: DC | PRN

## 2020-09-24 MED FILL — MELOXICAM 7.5 MG TABLET: 7.5 | 14 days supply | Qty: 14 | Fill #0

## 2020-09-24 NOTE — Progress Notes (Signed)
   Post-Op Visit Note   Patient: Julie Herman           Date of Birth: [birth year]           MRN: [id number] Visit Date: 09/24/2020 PCP: Hoy Register, MD   Assessment & Plan:  Chief Complaint:  Chief Complaint  Patient presents with  . Right Wrist - Follow-up    Carpal tunnel release 08/13/2020   Visit Diagnoses:  1. Right carpal tunnel syndrome     Plan: Patient is a pleasant 45 year old Spanish-speaking female who is here today with an interpreter.  She is 6 weeks out right carpal tunnel release for moderate median nerve compression.  She has been doing okay.  She still notes moderate pallor pain.  She has an occasional tingling to the right index fingertip.  He is not taking any medication for pain.  Examination of her right hand reveals a fully healed surgical scar without complication.  At this point, believe she would benefit from hand therapy.  I sent in a referral for this.  I have also started her on an anti-inflammatory.  She also brings up today left carpal tunnel release for history of moderate median nerve compression.  We have discussed improving her right hand symptoms first.  She will follow up with [place] in 6 weeks time for recheck of the right hand and to probably go ahead and schedule the left carpal tunnel release.  Follow-Up Instructions: Return in about 6 weeks (around 11/05/2020).   Orders:  Orders Placed This Encounter  Procedures  . Ambulatory referral to Physical Therapy   Meds ordered this encounter  Medications  . meloxicam (MOBIC) 7.5 MG tablet    Sig: Take 1 tablet (7.5 mg total) by mouth daily as needed for up to 14 doses for pain.    Dispense:  30 tablet    Refill:  2    Imaging: No new imaging  PMFS History: Patient Active Problem List   Diagnosis Date Noted  . Right carpal tunnel syndrome 08/20/2020  . Carpal tunnel syndrome on right 08/13/2020  . Menorrhagia 04/10/2016  . Language barrier 09/14/2014  . Hyperlipidemia  04/10/2014   Past Medical History:  Diagnosis Date  . GERD (gastroesophageal reflux disease)   . Neuromuscular disorder (HCC)     Family History  Problem Relation Age of Onset  . Heart disease Mother   . Diabetes Brother   . Diabetes Maternal Aunt   . Diabetes Paternal Aunt     Past Surgical History:  Procedure Laterality Date  . CARPAL TUNNEL RELEASE Right 08/13/2020   Procedure: RIGHT CARPAL TUNNEL RELEASE;  Surgeon: Tarry Kos, MD;  Location: [facility];  Service: Orthopedics;  Laterality: Right;  Bier block  . NO PAST SURGERIES     Social History   Occupational History  . Not on file  Tobacco Use  . Smoking status: Never Smoker  . Smokeless tobacco: Never Used  Substance and Sexual Activity  . Alcohol use: Yes    Alcohol/week: 0.0 standard drinks  . Drug use: No  . Sexual activity: Yes    Birth control/protection: None

## 2020-09-30 ENCOUNTER — Ambulatory Visit (INDEPENDENT_AMBULATORY_CARE_PROVIDER_SITE_OTHER): Payer: Self-pay | Admitting: Orthopaedic Surgery

## 2020-09-30 ENCOUNTER — Ambulatory Visit (INDEPENDENT_AMBULATORY_CARE_PROVIDER_SITE_OTHER): Payer: Self-pay

## 2020-09-30 ENCOUNTER — Encounter: Payer: Self-pay | Admitting: Orthopaedic Surgery

## 2020-09-30 VITALS — Ht 60.0 in | Wt 168.0 lb

## 2020-09-30 DIAGNOSIS — G8929 Other chronic pain: Secondary | ICD-10-CM

## 2020-09-30 DIAGNOSIS — M25511 Pain in right shoulder: Secondary | ICD-10-CM

## 2020-09-30 MED ORDER — LIDOCAINE HCL 1 % IJ SOLN
3.0000 mL | INTRAMUSCULAR | Status: AC | PRN
  Administered 2020-09-30: 3 mL

## 2020-09-30 MED ORDER — BUPIVACAINE HCL 0.5 % IJ SOLN
3.0000 mL | INTRAMUSCULAR | Status: AC | PRN
  Administered 2020-09-30: 3 mL via INTRA_ARTICULAR

## 2020-09-30 MED ORDER — METHYLPREDNISOLONE ACETATE 40 MG/ML IJ SUSP
40.0000 mg | INTRAMUSCULAR | Status: AC | PRN
  Administered 2020-09-30: 40 mg via INTRA_ARTICULAR

## 2020-09-30 NOTE — Progress Notes (Addendum)
Office Visit Note   Patient: Julie Herman           Date of Birth: [birth year]           MRN: [id number] Visit Date: 09/30/2020              Requested by: Hoy Register, MD [address] [place],  [place] [address] PCP: Hoy Register, MD   Assessment & Plan: Visit Diagnoses:  1. Chronic right shoulder pain     Plan: Impression is chronic right shoulder pain.  Not exactly sure what the disease process is but overall seems to have generalized shoulder pain.  Rotator cuff feels really strong.  Possible labral pathology.  I recommended trying a subacromial injection coupled with 6 weeks of outpatient physical therapy as an initial line of treatment.  She will follow-up at that time if she does not notice any improvement.  Follow-Up Instructions: Return if symptoms worsen or fail to improve.   Orders:  Orders Placed This Encounter  Procedures  . Large Joint Inj: R subacromial bursa  . XR Shoulder Right  . Ambulatory referral to Occupational Therapy   Meds ordered this encounter  Medications  . lidocaine (XYLOCAINE) 1 % (with pres) injection 3 mL  . bupivacaine (MARCAINE) 0.5 % (with pres) injection 3 mL  . methylPREDNISolone acetate (DEPO-MEDROL) injection 40 mg      Procedures: Large Joint Inj: R subacromial bursa on 09/30/2020 7:38 PM Indications: pain Details: 22 G needle  Arthrogram: No  Medications: 3 mL lidocaine 1 %; 3 mL bupivacaine 0.5 %; 40 mg methylPREDNISolone acetate 40 MG/ML Outcome: tolerated well, no immediate complications Consent was given by the patient. Patient was prepped and draped in the usual sterile fashion.       Clinical Data: No additional findings.   Subjective: Chief Complaint  Patient presents with  . Right Shoulder - Pain    Julie Herman comes in today with Spanish interpreter for evaluation of a separate issue of right shoulder pain for years.  Denies any injuries.  She describes the pain as a cold and icy  feeling in her upper arm.  Denies any numbness and tingling.  Occasional radiation of pain into the neck region.  She has had some difficulty in sleeping due to the pain.  Denies any previous shoulder surgeries.  Currently is not working.  She is about 6 weeks status post right carpal tunnel release.  Subjective decreased range of motion.   Review of Systems  Constitutional: Negative.   HENT: Negative.   Eyes: Negative.   Respiratory: Negative.   Cardiovascular: Negative.   Endocrine: Negative.   Musculoskeletal: Negative.   Neurological: Negative.   Hematological: Negative.   Psychiatric/Behavioral: Negative.   All other systems reviewed and are negative.    Objective: Vital Signs: Ht 5' (1.524 m)   Wt 168 lb (76.2 kg)   LMP 09/29/2020   BMI 32.81 kg/m   Physical Exam Vitals and nursing note reviewed.  Constitutional:      Appearance: She is well-developed.  HENT:     Head: Normocephalic and atraumatic.  Pulmonary:     Effort: Pulmonary effort is normal.  Abdominal:     Palpations: Abdomen is soft.  Musculoskeletal:     Cervical back: Neck supple.  Skin:    General: Skin is warm.     Capillary Refill: Capillary refill takes less than 2 seconds.  Neurological:     Mental Status: She is alert and oriented to  person, place, and time.  Psychiatric:        Behavior: Behavior normal.        Thought Content: Thought content normal.        Judgment: Judgment normal.     Ortho Exam Right shoulder shows full active and passive range of motion with mild pain.  Manual muscle testing of the rotator cuff is normal.  There is no joint laxity.  Mild pain with crank test, Hawkins test, O'Brien test, empty can test. Specialty Comments:  No specialty comments available.  Imaging: No results found.   PMFS History: Patient Active Problem List   Diagnosis Date Noted  . Right carpal tunnel syndrome 08/20/2020  . Carpal tunnel syndrome on right 08/13/2020  . Menorrhagia  04/10/2016  . Language barrier 09/14/2014  . Hyperlipidemia 04/10/2014   Past Medical History:  Diagnosis Date  . GERD (gastroesophageal reflux disease)   . Neuromuscular disorder (HCC)     Family History  Problem Relation Age of Onset  . Heart disease Mother   . Diabetes Brother   . Diabetes Maternal Aunt   . Diabetes Paternal Aunt     Past Surgical History:  Procedure Laterality Date  . CARPAL TUNNEL RELEASE Right 08/13/2020   Procedure: RIGHT CARPAL TUNNEL RELEASE;  Surgeon: Tarry Kos, MD;  Location: [facility];  Service: Orthopedics;  Laterality: Right;  Bier block  . NO PAST SURGERIES     Social History   Occupational History  . Not on file  Tobacco Use  . Smoking status: Never Smoker  . Smokeless tobacco: Never Used  Substance and Sexual Activity  . Alcohol use: Yes    Alcohol/week: 0.0 standard drinks  . Drug use: No  . Sexual activity: Yes    Birth control/protection: None

## 2020-10-02 ENCOUNTER — Other Ambulatory Visit: Payer: Self-pay

## 2020-10-03 ENCOUNTER — Other Ambulatory Visit: Payer: Self-pay

## 2020-10-03 MED ORDER — DULOXETINE HCL 30 MG PO CPEP: ORAL_CAPSULE | ORAL | 1 refills | Status: DC

## 2020-10-03 MED ORDER — FAMOTIDINE 20 MG PO TABS: ORAL_TABLET | ORAL | 1 refills | Status: DC

## 2020-10-04 ENCOUNTER — Other Ambulatory Visit: Payer: Self-pay

## 2020-10-07 ENCOUNTER — Other Ambulatory Visit: Payer: Self-pay

## 2020-10-07 ENCOUNTER — Ambulatory Visit
Admission: RE | Admit: 2020-10-07 | Discharge: 2020-10-07 | Disposition: A | Discharge status: Home / Self Care | Payer: No Typology Code available for payment source | Source: Ambulatory Visit | Attending: Internal Medicine | Admitting: Internal Medicine

## 2020-10-07 DIAGNOSIS — Z1231 Encounter for screening mammogram for malignant neoplasm of breast: Secondary | ICD-10-CM

## 2020-10-11 NOTE — Addendum Note (Signed)
Addended by: Mayra Reel on: 10/11/2020 08:34 PM   Modules accepted: Orders

## 2020-10-14 ENCOUNTER — Telehealth: Payer: Self-pay

## 2020-10-14 ENCOUNTER — Ambulatory Visit: Payer: Self-pay | Admitting: *Deleted

## 2020-10-14 NOTE — Telephone Encounter (Signed)
Assisted by Alecia Lemming, Interpreter # [phone]; pt given result and recommendations per Dr Jonah Blue,  "Let patient know that her mammogram is normal. Repeat again in 1 year"; the pt verbalized understanding; also see result note..  Reason for Disposition . General information question, no triage required and triager able to answer question  Answer Assessment - Initial Assessment Questions 1. REASON FOR CALL or QUESTION: "What is your reason for calling today?" or "How can I best help you?" or "What question do you have that I can help answer?"     Result of mammogram  Protocols used: INFORMATION ONLY CALL - NO TRIAGE-A-AH

## 2020-10-14 NOTE — Telephone Encounter (Signed)
[employer]  raul Id# [phone] contacted pt to go over mm results pt didn't answer lvm

## 2020-11-05 ENCOUNTER — Encounter (HOSPITAL_BASED_OUTPATIENT_CLINIC_OR_DEPARTMENT_OTHER): Payer: Self-pay | Admitting: Orthopaedic Surgery

## 2020-11-05 ENCOUNTER — Ambulatory Visit (INDEPENDENT_AMBULATORY_CARE_PROVIDER_SITE_OTHER): Payer: Self-pay | Admitting: Orthopaedic Surgery

## 2020-11-05 ENCOUNTER — Encounter: Payer: Self-pay | Admitting: Orthopaedic Surgery

## 2020-11-05 ENCOUNTER — Other Ambulatory Visit: Payer: Self-pay

## 2020-11-05 VITALS — Ht 60.0 in | Wt 168.0 lb

## 2020-11-05 DIAGNOSIS — G8929 Other chronic pain: Secondary | ICD-10-CM

## 2020-11-05 DIAGNOSIS — G5602 Carpal tunnel syndrome, left upper limb: Secondary | ICD-10-CM

## 2020-11-05 DIAGNOSIS — M25511 Pain in right shoulder: Secondary | ICD-10-CM

## 2020-11-05 NOTE — Progress Notes (Signed)
Office Visit Note   Patient: Julie Herman           Date of Birth: 12/21/1975           MRN: [id number] Visit Date: 11/05/2020              Requested by: Hoy Register, MD [address] [place],  [place] [address] PCP: Hoy Register, MD   Assessment & Plan: Visit Diagnoses:  1. Left carpal tunnel syndrome   2. Chronic right shoulder pain     Plan: At this time Julie Herman is most symptomatic and concerned with the left carpal tunnel syndrome and she would like to have surgical release as soon as possible.  Should her right shoulder seems to be improving since the subacromial injection.  The arthritis for the right thumb she will just go ahead and treat with symptoms and activity modifications and brace for support.  Follow-Up Instructions: Return for Postop.   Orders:  No orders of the defined types were placed in this encounter.  No orders of the defined types were placed in this encounter.     Procedures: No procedures performed   Clinical Data: No additional findings.   Subjective: Chief Complaint  Patient presents with  . Right Shoulder - Follow-up    Julie Herman returns today for follow-up of right shoulder pain status postinjection.  The injection did help she never received a phone call from physical therapy per the patient.  She feels like the shoulder is improving.  Her right thumb is also symptomatic during grasping and use for daily activities.  Her left carpal tunnel symptoms have been fairly severe.   Review of Systems  Constitutional: Negative.   HENT: Negative.   Eyes: Negative.   Respiratory: Negative.   Cardiovascular: Negative.   Endocrine: Negative.   Musculoskeletal: Negative.   Neurological: Negative.   Hematological: Negative.   Psychiatric/Behavioral: Negative.   All other systems reviewed and are negative.    Objective: Vital Signs: Ht 5' (1.524 m)   Wt 168 lb (76.2 kg)   BMI 32.81 kg/m   Physical Exam Vitals  and nursing note reviewed.  Constitutional:      Appearance: She is well-developed.  Pulmonary:     Effort: Pulmonary effort is normal.  Skin:    General: Skin is warm.     Capillary Refill: Capillary refill takes less than 2 seconds.  Neurological:     Mental Status: She is alert and oriented to person, place, and time.  Psychiatric:        Behavior: Behavior normal.        Thought Content: Thought content normal.        Judgment: Judgment normal.     Ortho Exam Right hand shows a fully healed carpal tunnel surgical scar.  She has pain with grind test.  Negative crepitus. Right shoulder range of motion appears to be improving and strength is also improving. Left hand exam is unchanged. Specialty Comments:  No specialty comments available.  Imaging: No results found.   PMFS History: Patient Active Problem List   Diagnosis Date Noted  . Right carpal tunnel syndrome 08/20/2020  . Carpal tunnel syndrome on right 08/13/2020  . Menorrhagia 04/10/2016  . Language barrier 09/14/2014  . Hyperlipidemia 04/10/2014   Past Medical History:  Diagnosis Date  . GERD (gastroesophageal reflux disease)   . Neuromuscular disorder (HCC)     Family History  Problem Relation Age of Onset  . Heart disease Mother   .  Diabetes Brother   . Diabetes Maternal Aunt   . Diabetes Paternal Aunt     Past Surgical History:  Procedure Laterality Date  . CARPAL TUNNEL RELEASE Right 08/13/2020   Procedure: RIGHT CARPAL TUNNEL RELEASE;  Surgeon: Tarry Kos, MD;  Location: [facility];  Service: Orthopedics;  Laterality: Right;  Bier block  . NO PAST SURGERIES     Social History   Occupational History  . Not on file  Tobacco Use  . Smoking status: Never Smoker  . Smokeless tobacco: Never Used  Substance and Sexual Activity  . Alcohol use: Yes    Alcohol/week: 0.0 standard drinks  . Drug use: No  . Sexual activity: Yes    Birth control/protection: None

## 2020-11-09 ENCOUNTER — Other Ambulatory Visit (HOSPITAL_COMMUNITY): Payer: No Typology Code available for payment source

## 2020-11-10 ENCOUNTER — Other Ambulatory Visit (HOSPITAL_COMMUNITY)
Admission: RE | Admit: 2020-11-10 | Discharge: 2020-11-10 | Disposition: A | Discharge status: Home / Self Care | Payer: Self-pay | Source: Ambulatory Visit | Attending: Orthopaedic Surgery | Admitting: Orthopaedic Surgery

## 2020-11-10 DIAGNOSIS — Z20822 Contact with and (suspected) exposure to covid-19: Secondary | ICD-10-CM | POA: Insufficient documentation

## 2020-11-10 DIAGNOSIS — Z01812 Encounter for preprocedural laboratory examination: Secondary | ICD-10-CM | POA: Insufficient documentation

## 2020-11-10 LAB — SARS CORONAVIRUS 2 (TAT 6-24 HRS): SARS Coronavirus 2: NEGATIVE

## 2020-11-12 ENCOUNTER — Other Ambulatory Visit: Payer: Self-pay

## 2020-11-12 ENCOUNTER — Ambulatory Visit (HOSPITAL_BASED_OUTPATIENT_CLINIC_OR_DEPARTMENT_OTHER)
Admission: RE | Admit: 2020-11-12 | Discharge: 2020-11-12 | Disposition: A | Discharge status: Home / Self Care | Payer: Self-pay | Attending: Orthopaedic Surgery | Admitting: Orthopaedic Surgery

## 2020-11-12 ENCOUNTER — Ambulatory Visit (HOSPITAL_BASED_OUTPATIENT_CLINIC_OR_DEPARTMENT_OTHER): Payer: Self-pay | Admitting: Certified Registered"

## 2020-11-12 ENCOUNTER — Encounter (HOSPITAL_BASED_OUTPATIENT_CLINIC_OR_DEPARTMENT_OTHER)
Admission: RE | Disposition: A | Discharge status: Home / Self Care | Payer: Self-pay | Source: Home / Self Care | Attending: Orthopaedic Surgery

## 2020-11-12 ENCOUNTER — Encounter (HOSPITAL_BASED_OUTPATIENT_CLINIC_OR_DEPARTMENT_OTHER): Payer: Self-pay | Admitting: Orthopaedic Surgery

## 2020-11-12 DIAGNOSIS — G5602 Carpal tunnel syndrome, left upper limb: Secondary | ICD-10-CM | POA: Insufficient documentation

## 2020-11-12 DIAGNOSIS — K219 Gastro-esophageal reflux disease without esophagitis: Secondary | ICD-10-CM | POA: Insufficient documentation

## 2020-11-12 DIAGNOSIS — Z8249 Family history of ischemic heart disease and other diseases of the circulatory system: Secondary | ICD-10-CM | POA: Insufficient documentation

## 2020-11-12 DIAGNOSIS — Z791 Long term (current) use of non-steroidal anti-inflammatories (NSAID): Secondary | ICD-10-CM | POA: Insufficient documentation

## 2020-11-12 DIAGNOSIS — Z833 Family history of diabetes mellitus: Secondary | ICD-10-CM | POA: Insufficient documentation

## 2020-11-12 HISTORY — PX: CARPAL TUNNEL RELEASE: SHX101

## 2020-11-12 LAB — POCT PREGNANCY, URINE: Preg Test, Ur: NEGATIVE

## 2020-11-12 SURGERY — CARPAL TUNNEL RELEASE
Anesthesia: Monitor Anesthesia Care | Site: Wrist | Laterality: Left

## 2020-11-12 MED ORDER — FENTANYL CITRATE (PF) 100 MCG/2ML IJ SOLN
INTRAMUSCULAR | Status: DC | PRN
  Administered 2020-11-12 (×4): 25 ug via INTRAVENOUS

## 2020-11-12 MED ORDER — OXYCODONE HCL 5 MG/5ML PO SOLN: 5.0000 mg | Freq: Once | ORAL | Status: DC | PRN

## 2020-11-12 MED ORDER — LIDOCAINE HCL 1 % IJ SOLN
INTRAMUSCULAR | Status: DC | PRN
  Administered 2020-11-12: 10 mL via INTRAMUSCULAR

## 2020-11-12 MED ORDER — LACTATED RINGERS IV SOLN: INTRAVENOUS | Status: DC

## 2020-11-12 MED ORDER — HYDROCODONE-ACETAMINOPHEN 5-325 MG PO TABS: 1.0000 | ORAL_TABLET | Freq: Four times a day (QID) | ORAL | 0 refills | Status: DC | PRN

## 2020-11-12 MED ORDER — ACETAMINOPHEN 500 MG PO TABS
1000.0000 mg | ORAL_TABLET | Freq: Once | ORAL | Status: AC
  Administered 2020-11-12: 1000 mg via ORAL

## 2020-11-12 MED ORDER — LIDOCAINE 2% (20 MG/ML) 5 ML SYRINGE
INTRAMUSCULAR | Status: AC
  Filled 2020-11-12: qty 5

## 2020-11-12 MED ORDER — ACETAMINOPHEN 500 MG PO TABS
ORAL_TABLET | ORAL | Status: AC
  Filled 2020-11-12: qty 2

## 2020-11-12 MED ORDER — KETOROLAC TROMETHAMINE 30 MG/ML IJ SOLN
INTRAMUSCULAR | Status: DC | PRN
  Administered 2020-11-12: 30 mg via INTRAVENOUS

## 2020-11-12 MED ORDER — BUPIVACAINE HCL (PF) 0.25 % IJ SOLN: INTRAMUSCULAR | Status: DC | PRN

## 2020-11-12 MED ORDER — MIDAZOLAM HCL 5 MG/5ML IJ SOLN
INTRAMUSCULAR | Status: DC | PRN
  Administered 2020-11-12: 2 mg via INTRAVENOUS

## 2020-11-12 MED ORDER — KETOROLAC TROMETHAMINE 30 MG/ML IJ SOLN
INTRAMUSCULAR | Status: AC
  Filled 2020-11-12: qty 1

## 2020-11-12 MED ORDER — MIDAZOLAM HCL 2 MG/2ML IJ SOLN
INTRAMUSCULAR | Status: AC
  Filled 2020-11-12: qty 2

## 2020-11-12 MED ORDER — CEFAZOLIN SODIUM-DEXTROSE 2-4 GM/100ML-% IV SOLN
2.0000 g | INTRAVENOUS | Status: AC
  Administered 2020-11-12: 2 g via INTRAVENOUS

## 2020-11-12 MED ORDER — ONDANSETRON HCL 4 MG/2ML IJ SOLN
INTRAMUSCULAR | Status: DC | PRN
  Administered 2020-11-12: 4 mg via INTRAVENOUS

## 2020-11-12 MED ORDER — FENTANYL CITRATE (PF) 100 MCG/2ML IJ SOLN: 25.0000 ug | INTRAMUSCULAR | Status: DC | PRN

## 2020-11-12 MED ORDER — LIDOCAINE 2% (20 MG/ML) 5 ML SYRINGE
INTRAMUSCULAR | Status: DC | PRN
  Administered 2020-11-12: 40 mg via INTRAVENOUS

## 2020-11-12 MED ORDER — CEFAZOLIN SODIUM-DEXTROSE 2-4 GM/100ML-% IV SOLN
INTRAVENOUS | Status: AC
  Filled 2020-11-12: qty 100

## 2020-11-12 MED ORDER — FENTANYL CITRATE (PF) 100 MCG/2ML IJ SOLN
INTRAMUSCULAR | Status: AC
  Filled 2020-11-12: qty 2

## 2020-11-12 MED ORDER — OXYCODONE HCL 5 MG PO TABS: 5.0000 mg | ORAL_TABLET | Freq: Once | ORAL | Status: DC | PRN

## 2020-11-12 MED ORDER — PROPOFOL 10 MG/ML IV BOLUS
INTRAVENOUS | Status: AC
  Filled 2020-11-12: qty 20

## 2020-11-12 MED ORDER — PROPOFOL 500 MG/50ML IV EMUL
INTRAVENOUS | Status: DC | PRN
  Administered 2020-11-12: 30 mg via INTRAVENOUS
  Administered 2020-11-12: 120 ug/kg/min via INTRAVENOUS
  Administered 2020-11-12: 20 mg via INTRAVENOUS

## 2020-11-12 MED ORDER — PROMETHAZINE HCL 25 MG/ML IJ SOLN
6.2500 mg | INTRAMUSCULAR | Status: DC | PRN
Start: 2020-11-12 — End: 2020-11-12

## 2020-11-12 SURGICAL SUPPLY — 47 items
BAND INSRT 18 STRL LF DISP RB (MISCELLANEOUS) ×2
BAND RUBBER #18 3X1/16 STRL (MISCELLANEOUS) ×4 IMPLANT
BLADE MINI RND TIP GREEN BEAV (BLADE) ×2 IMPLANT
BLADE SURG 15 STRL LF DISP TIS (BLADE) ×1 IMPLANT
BLADE SURG 15 STRL SS (BLADE) ×2
BNDG CMPR 9X4 STRL LF SNTH (GAUZE/BANDAGES/DRESSINGS) ×1
BNDG ELASTIC 3X5.8 VLCR STR LF (GAUZE/BANDAGES/DRESSINGS) ×2 IMPLANT
BNDG ESMARK 4X9 LF (GAUZE/BANDAGES/DRESSINGS) ×2 IMPLANT
BNDG PLASTER X FAST 3X3 WHT LF (CAST SUPPLIES) IMPLANT
BNDG PLSTR 9X3 FST ST WHT (CAST SUPPLIES)
BRUSH SCRUB EZ PLAIN DRY (MISCELLANEOUS) ×2 IMPLANT
CANISTER SUCT 1200ML W/VALVE (MISCELLANEOUS) ×2 IMPLANT
CORD BIPOLAR FORCEPS 12FT (ELECTRODE) ×2 IMPLANT
COVER BACK TABLE 60X90IN (DRAPES) ×2 IMPLANT
COVER MAYO STAND STRL (DRAPES) ×2 IMPLANT
CUFF TOURN SGL QUICK 18X4 (TOURNIQUET CUFF) ×1 IMPLANT
DECANTER SPIKE VIAL GLASS SM (MISCELLANEOUS) IMPLANT
DRAPE EXTREMITY T 121X128X90 (DISPOSABLE) ×2 IMPLANT
DRAPE IMP U-DRAPE 54X76 (DRAPES) ×2 IMPLANT
DRAPE SURG 17X23 STRL (DRAPES) ×2 IMPLANT
GAUZE SPONGE 4X4 12PLY STRL (GAUZE/BANDAGES/DRESSINGS) ×2 IMPLANT
GAUZE XEROFORM 1X8 LF (GAUZE/BANDAGES/DRESSINGS) ×2 IMPLANT
GLOVE SURG ENC MOIS LTX SZ6.5 (GLOVE) ×1 IMPLANT
GLOVE SURG LTX SZ7 (GLOVE) ×2 IMPLANT
GLOVE SURG NEOP MICRO LF SZ7.5 (GLOVE) ×2 IMPLANT
GLOVE SURG SYN 7.5  E (GLOVE) ×2
GLOVE SURG SYN 7.5 E (GLOVE) ×1 IMPLANT
GLOVE SURG SYN 7.5 PF PI (GLOVE) ×1 IMPLANT
GLOVE SURG UNDER POLY LF SZ7 (GLOVE) ×4 IMPLANT
GOWN STRL REIN XL XLG (GOWN DISPOSABLE) ×4 IMPLANT
GOWN STRL REUS W/ TWL LRG LVL3 (GOWN DISPOSABLE) ×1 IMPLANT
GOWN STRL REUS W/TWL LRG LVL3 (GOWN DISPOSABLE) ×2
NDL HYPO 25X1 1.5 SAFETY (NEEDLE) IMPLANT
NEEDLE HYPO 25X1 1.5 SAFETY (NEEDLE) ×2 IMPLANT
NS IRRIG 1000ML POUR BTL (IV SOLUTION) ×2 IMPLANT
PACK BASIN DAY SURGERY FS (CUSTOM PROCEDURE TRAY) ×2 IMPLANT
PAD CAST 3X4 CTTN HI CHSV (CAST SUPPLIES) ×1 IMPLANT
PADDING CAST COTTON 3X4 STRL (CAST SUPPLIES) ×2
SHEET MEDIUM DRAPE 40X70 STRL (DRAPES) ×2 IMPLANT
STOCKINETTE 4X48 STRL (DRAPES) ×2 IMPLANT
SUT ETHILON 3 0 PS 1 (SUTURE) ×2 IMPLANT
SYR BULB EAR ULCER 3OZ GRN STR (SYRINGE) ×2 IMPLANT
SYR CONTROL 10ML LL (SYRINGE) ×1 IMPLANT
TOWEL GREEN STERILE FF (TOWEL DISPOSABLE) ×2 IMPLANT
TRAY DSU PREP LF (CUSTOM PROCEDURE TRAY) ×2 IMPLANT
TUBE CONNECTING 20X1/4 (TUBING) IMPLANT
UNDERPAD 30X36 HEAVY ABSORB (UNDERPADS AND DIAPERS) ×2 IMPLANT

## 2020-11-12 NOTE — Transfer of Care (Signed)
Immediate Anesthesia Transfer of Care Note  Patient: Julie Herman  Procedure(s) Performed: LEFT CARPAL TUNNEL RELEASE (Left Wrist)  Patient Location: PACU  Anesthesia Type:MAC  Level of Consciousness: drowsy  Airway & Oxygen Therapy: Patient Spontanous Breathing and Patient connected to face mask oxygen  Post-op Assessment: Report given to RN and Post -op Vital signs reviewed and stable  Post vital signs: Reviewed and stable  Last Vitals:  Vitals Value Taken Time  BP    Temp    Pulse 80 11/12/20 1216  Resp    SpO2 99 % 11/12/20 1216  Vitals shown include unvalidated device data.  Last Pain:  Vitals:   11/12/20 1100  TempSrc: Oral  PainSc: 3       Patients Stated Pain Goal: 5 (48/01/65 5374)  Complications: No complications documented.

## 2020-11-12 NOTE — Discharge Instructions (Signed)
Postoperative instructions:  Dressing instructions: Keep your dressing and/or splint clean and dry at all times.  It will be removed at your first post-operative appointment.  Your stitches and/or staples will be removed at this visit.  Incision instructions:  Do not soak your incision for 3 weeks after surgery.  If the incision gets wet, pat dry and do not scrub the incision.  Pain control:  You have been given a prescription to be taken as directed for post-operative pain control.  In addition, elevate the operative extremity above the heart at all times to prevent swelling and throbbing pain.  Take over-the-counter Colace, 100mg  by mouth twice a day while taking narcotic pain medications to help prevent constipation.  Follow up appointments: 1) 7 days for wound check. 2) Dr. as scheduled.   -------------------------------------------------------------------------------------------------------------  After Surgery Pain Control:  After your surgery, post-surgical discomfort or pain is likely. This discomfort can last several days to a few weeks. At certain times of the day your discomfort may be more intense.  Did you receive a nerve block?  A nerve block can provide pain relief for one hour to two days after your surgery. As long as the nerve block is working, you will experience little or no sensation in the area the surgeon operated on.  As the nerve block wears off, you will begin to experience pain or discomfort. It is very important that you begin taking your prescribed pain medication before the nerve block fully wears off. Treating your pain at the first sign of the block wearing off will ensure your pain is better controlled and more tolerable when full-sensation returns. Do not wait until the pain is intolerable, as the medicine will be less effective. It is better to treat pain in advance than to try and catch up.  General Anesthesia:  If you did not receive a nerve block  during your surgery, you will need to start taking your pain medication shortly after your surgery and should continue to do so as prescribed by your surgeon.  Pain Medication:  Most commonly we prescribe Vicodin and Percocet for post-operative pain. Both of these medications contain a combination of acetaminophen (Tylenol) and a narcotic to help control pain.   It takes between 30 and 45 minutes before pain medication starts to work. It is important to take your medication before your pain level gets too intense.   Nausea is a common side effect of many pain medications. You will want to eat something before taking your pain medicine to help prevent nausea.   If you are taking a prescription pain medication that contains acetaminophen, we recommend that you do not take additional over the counter acetaminophen (Tylenol).  Other pain relieving options:   Using a cold pack to ice the affected area a few times a day (15 to 20 minutes at a time) can help to relieve pain, reduce swelling and bruising.   Elevation of the affected area can also help to reduce pain and swelling.  May take NSAIDS (Ibuprofen, Motrin) after 6pm, if needed.  May take Tylenol after 5pm, if needed.    Post Anesthesia Home Care Instructions  Activity: Get plenty of rest for the remainder of the day. A responsible individual must stay with you for 24 hours following the procedure.  For the next 24 hours, DO NOT: -Drive a car -Roda Shutters -Drink alcoholic beverages -Take any medication unless instructed by your physician -Make any legal decisions or sign important papers.  Meals: Start with liquid foods such as gelatin or soup. Progress to regular foods as tolerated. Avoid greasy, spicy, heavy foods. If nausea and/or vomiting occur, drink only clear liquids until the nausea and/or vomiting subsides. Call your physician if vomiting continues.  Special Instructions/Symptoms: Your throat may feel dry or sore  from the anesthesia or the breathing tube placed in your throat during surgery. If this causes discomfort, gargle with warm salt water. The discomfort should disappear within 24 hours.  If you had a scopolamine patch placed behind your ear for the management of post- operative nausea and/or vomiting:  1. The medication in the patch is effective for 72 hours, after which it should be removed.  Wrap patch in a tissue and discard in the trash. Wash hands thoroughly with soap and water. 2. You may remove the patch earlier than 72 hours if you experience unpleasant side effects which may include dry mouth, dizziness or visual disturbances. 3. Avoid touching the patch. Wash your hands with soap and water after contact with the patch.

## 2020-11-12 NOTE — H&P (Signed)
PREOPERATIVE H&P  Chief Complaint: left carpal tunnel syndrome  HPI: Julie Herman is a 45 y.o. female who presents for surgical treatment of left carpal tunnel syndrome.  She denies any changes in medical history.  Past Medical History:  Diagnosis Date  . GERD (gastroesophageal reflux disease)   . Neuromuscular disorder [facility])    Past Surgical History:  Procedure Laterality Date  . CARPAL TUNNEL RELEASE Right 08/13/2020   Procedure: RIGHT CARPAL TUNNEL RELEASE;  Surgeon: Tarry Kos, MD;  Location: [facility];  Service: Orthopedics;  Laterality: Right;  Bier block  . NO PAST SURGERIES     Social History   Socioeconomic History  . Marital status: Single    Spouse name: Not on file  . Number of children: Not on file  . Years of education: Not on file  . Highest education level: Not on file  Occupational History  . Not on file  Tobacco Use  . Smoking status: Never Smoker  . Smokeless tobacco: Never Used  Substance and Sexual Activity  . Alcohol use: Yes    Alcohol/week: 0.0 standard drinks  . Drug use: No  . Sexual activity: Yes    Birth control/protection: None  Other Topics Concern  . Not on file  Social History Narrative  . Not on file   Social Determinants of Health   Financial Resource Strain: Not on file  Food Insecurity: Not on file  Transportation Needs: Not on file  Physical Activity: Not on file  Stress: Not on file  Social Connections: Not on file   Family History  Problem Relation Age of Onset  . Heart disease Mother   . Diabetes Brother   . Diabetes Maternal Aunt   . Diabetes Paternal Aunt    No Known Allergies Prior to Admission medications   Medication Sig Start Date End Date Taking? Authorizing Provider  omeprazole (PRILOSEC) 20 MG capsule TAKE 1 CAPSULE (20 MG TOTAL) BY MOUTH IN THE MORNING AND AT BEDTIME. TO REDUCE STOMACH ACID 05/14/20 05/14/21 Yes Claiborne Rigg, NP  DULoxetine (CYMBALTA) 30 MG  capsule Take 1 capsule by mouth daily. 06/14/20 06/14/21  Claiborne Rigg, NP  famotidine (PEPCID) 20 MG tablet take 1 tablet by mouth at bedtime. 05/14/20 05/14/21  Claiborne Rigg, NP  HYDROcodone-acetaminophen (NORCO) 5-325 MG tablet Take 1 tablet by mouth every 6 (six) hours as needed. 08/13/20   Tarry Kos, MD  loratadine (CLARITIN) 10 MG tablet Take 10 mg by mouth daily.    [provider]  loratadine (CLARITIN) 10 MG tablet TOME 1 PASTILLA POR VIA ORAL UNA VEZ AL DIA PARA CONGESTION 03/31/20 03/31/21  Fulp, Cammie, MD  meloxicam (MOBIC) 7.5 MG tablet TAKE 1 TABLET (7.5 MG TOTAL) BY MOUTH DAILY AS NEEDED FOR UP TO 14 DOSES FOR PAIN. 09/24/20 09/24/21  Cristie Hem, PA-C  gabapentin (NEURONTIN) 100 MG capsule Take 1 capsule (100 mg total) by mouth at bedtime. 05/14/20 06/14/20  Claiborne Rigg, NP     Positive ROS: All other systems have been reviewed and were otherwise negative with the exception of those mentioned in the HPI and as above.  Physical Exam: General: Alert, no acute distress Cardiovascular: No pedal edema Respiratory: No cyanosis, no use of accessory musculature GI: abdomen soft Skin: No lesions in the area of chief complaint Neurologic: Sensation intact distally Psychiatric: Patient is competent for consent with normal mood and affect Lymphatic: no lymphedema  MUSCULOSKELETAL: exam stable  Assessment: left carpal tunnel syndrome  Plan: Plan for Procedure(s): LEFT CARPAL TUNNEL RELEASE  The risks benefits and alternatives were discussed with the patient including but not limited to the risks of nonoperative treatment, versus surgical intervention including infection, bleeding, nerve injury,  blood clots, cardiopulmonary complications, morbidity, mortality, among others, and they were willing to proceed.   Preoperative templating of the joint replacement has been completed, documented, and submitted to the Operating Room personnel in order to optimize  intra-operative equipment management.   Glee Arvin, MD 11/12/2020 10:59 AM

## 2020-11-12 NOTE — Op Note (Signed)
   Carpal tunnel op note  DATE OF SURGERY:11/12/2020  PREOPERATIVE DIAGNOSIS:  Left carpal tunnel syndrome  POSTOPERATIVE DIAGNOSIS: same  PROCEDURE: Left carpal tunnel release. CPT 65784  SURGEON: Marianna Payment, M.D.  ASSIST: Madalyn Rob, [place]  ANESTHESIA:  Local and MAC  TOURNIQUET TIME: less than 20 minutes  BLOOD LOSS: Minimal.  COMPLICATIONS: None.  PATHOLOGY: None.  INDICATIONS: The patient is a 45 y.o. -year-old female who presented with carpal tunnel syndrome failing nonsurgical management, indicated for surgical release.  DESCRIPTION OF PROCEDURE: The patient was identified in the preoperative holding area.  The operative site was marked by the surgeon and confirmed by the patient.  The patient was brought back to the operating room.  MAC anesthesia was administered.  Local anesthetic with epi was injected into the operative site.  A well padded nonsterile tourniquet was placed. The operative extremity was prepped and draped in standard sterile fashion.  A timeout was performed.  Preoperative antibiotics were given.   A palmar incision was made about 5 mm ulnar to the thenar crease.  The palmar aponeurosis was exposed and divided in line with the skin incision. The palmaris brevis was visualized and divided.  The distal edge of the transcarpal ligament was identified. A hemostat was inserted into the carpal tunnel to protect the median nerve and the flexor tendons. Then, the transverse carpal ligament was released under direct visualization. Proximally, a subcutaneous tunnel was made allowing a Sewell retractor to be placed. Then, the distal portion of the antebrachial fascia was released. Distally, all fibrous bands were released. The median nerve was visualized, and the fat pad was exposed. Wound was irrigated and closed with 4-0 nylon sutures. Sterile dressing applied. The patient was transferred to the recovery room in stable condition after all counts were  correct.  POSTOPERATIVE PLAN: To start nerve gliding exercises as tolerated and no heavy lifting for four weeks.  Eduard Roux, M.D. Concepcion Living Turrell 12:07 PM'

## 2020-11-12 NOTE — Anesthesia Preprocedure Evaluation (Addendum)
Anesthesia Evaluation  Patient identified by MRN, date of birth, ID band Patient awake    Reviewed: Allergy & Precautions, NPO status , Patient's Chart, lab work & pertinent test results  History of Anesthesia Complications Negative for: history of anesthetic complications  Airway Mallampati: I  TM Distance: >3 FB Neck ROM: Full    Dental no notable dental hx.    Pulmonary neg pulmonary ROS,    Pulmonary exam normal        Cardiovascular negative cardio ROS Normal cardiovascular exam     Neuro/Psych negative neurological ROS     GI/Hepatic Neg liver ROS, GERD  Controlled and Medicated,  Endo/Other  negative endocrine ROS  Renal/GU negative Renal ROS  negative genitourinary   Musculoskeletal negative musculoskeletal ROS (+)   Abdominal   Peds  Hematology negative hematology ROS (+)   Anesthesia Other Findings Day of surgery medications reviewed with patient.  Reproductive/Obstetrics negative OB ROS                           Anesthesia Physical Anesthesia Plan  ASA: II  Anesthesia Plan: MAC   Post-op Pain Management:    Induction:   PONV Risk Score and Plan: 2 and Treatment may vary due to age or medical condition, Propofol infusion, Ondansetron and Midazolam  Airway Management Planned: Natural Airway and Nasal Cannula  Additional Equipment: None  Intra-op Plan:   Post-operative Plan:   Informed Consent: I have reviewed the patients History and Physical, chart, labs and discussed the procedure including the risks, benefits and alternatives for the proposed anesthesia with the patient or authorized representative who has indicated his/her understanding and acceptance.     Interpreter used for [employer] Discussed with: CRNA  Anesthesia Plan Comments:       Anesthesia Quick Evaluation

## 2020-11-12 NOTE — Anesthesia Postprocedure Evaluation (Signed)
Anesthesia Post Note  Patient: Julie Herman  Procedure(s) Performed: LEFT CARPAL TUNNEL RELEASE (Left Wrist)     Patient location during evaluation: PACU Anesthesia Type: MAC Level of consciousness: awake and alert and oriented Pain management: pain level controlled Vital Signs Assessment: post-procedure vital signs reviewed and stable Respiratory status: spontaneous breathing, nonlabored ventilation and respiratory function stable Cardiovascular status: blood pressure returned to baseline Postop Assessment: no apparent nausea or vomiting Anesthetic complications: no   No complications documented.  Last Vitals:  Vitals:   11/12/20 1233 11/12/20 1304  BP: 104/62 113/65  Pulse: 78 72  Resp: 16 17  Temp:  36.7 C  SpO2: 97% 97%    Last Pain:  Vitals:   11/12/20 1304  TempSrc:   PainSc: 0-No pain                 Brennan Bailey

## 2020-11-15 ENCOUNTER — Encounter (HOSPITAL_BASED_OUTPATIENT_CLINIC_OR_DEPARTMENT_OTHER): Payer: Self-pay | Admitting: Orthopaedic Surgery

## 2020-11-19 ENCOUNTER — Other Ambulatory Visit: Payer: Self-pay

## 2020-11-19 ENCOUNTER — Encounter: Payer: Self-pay | Admitting: Orthopaedic Surgery

## 2020-11-19 ENCOUNTER — Ambulatory Visit (INDEPENDENT_AMBULATORY_CARE_PROVIDER_SITE_OTHER): Payer: Self-pay | Admitting: Physician Assistant

## 2020-11-19 VITALS — Ht 61.0 in | Wt 167.0 lb

## 2020-11-19 DIAGNOSIS — G5602 Carpal tunnel syndrome, left upper limb: Secondary | ICD-10-CM

## 2020-11-19 DIAGNOSIS — Z9889 Other specified postprocedural states: Secondary | ICD-10-CM

## 2020-11-19 NOTE — Progress Notes (Signed)
   Post-Op Visit Note   Patient: Julie Herman           Date of Birth: Nov 29, 1975           MRN: [id number] Visit Date: 11/19/2020 PCP: Hoy Register, MD   Assessment & Plan:  Chief Complaint:  Chief Complaint  Patient presents with  . Left Wrist - Follow-up    Left carpal tunnel release 11/12/2020   Visit Diagnoses:  1. Carpal tunnel syndrome on left   2. S/P carpal tunnel release     Plan: Patient is a pleasant 45 year old Spanish-speaking female who comes in today 1 week out left carpal tunnel release for moderate median nerve compression.  She has been doing well.  She has minimal pain and is no longer requiring narcotic pain medication.  Overall, doing well.  Examination of her left hand reveals a fully healed surgical incision with nylon sutures in place.  No evidence of infection or cellulitis.  Fingers warm well perfused.  At this point, her incision was clean and covered.  She was provided with a Velcro wrist splint for which she will wear for the next week.  No heavy lifting or submerging her hand underwater for another 3 weeks.  Follow-up with [place] next week for suture removal.  Call with concerns or questions in meantime.  This was all discussed through Spanish-speaking interpreter.  Follow-Up Instructions: Return in about 1 week (around 11/26/2020).   Orders:  No orders of the defined types were placed in this encounter.  No orders of the defined types were placed in this encounter.   Imaging: No new imaging  PMFS History: Patient Active Problem List   Diagnosis Date Noted  . Carpal tunnel syndrome on left 11/12/2020  . Menorrhagia 04/10/2016  . Language barrier 09/14/2014  . Hyperlipidemia 04/10/2014   Past Medical History:  Diagnosis Date  . GERD (gastroesophageal reflux disease)   . Neuromuscular disorder (HCC)     Family History  Problem Relation Age of Onset  . Heart disease Mother   . Diabetes Brother   . Diabetes Maternal Aunt   .  Diabetes Paternal Aunt     Past Surgical History:  Procedure Laterality Date  . CARPAL TUNNEL RELEASE Right 08/13/2020   Procedure: RIGHT CARPAL TUNNEL RELEASE;  Surgeon: Tarry Kos, MD;  Location: [facility];  Service: Orthopedics;  Laterality: Right;  Bier block  . CARPAL TUNNEL RELEASE Left 11/12/2020   Procedure: LEFT CARPAL TUNNEL RELEASE;  Surgeon: Tarry Kos, MD;  Location: [facility];  Service: Orthopedics;  Laterality: Left;  . NO PAST SURGERIES     Social History   Occupational History  . Not on file  Tobacco Use  . Smoking status: Never Smoker  . Smokeless tobacco: Never Used  Substance and Sexual Activity  . Alcohol use: Yes    Alcohol/week: 0.0 standard drinks  . Drug use: No  . Sexual activity: Yes    Birth control/protection: None

## 2020-11-26 ENCOUNTER — Ambulatory Visit (INDEPENDENT_AMBULATORY_CARE_PROVIDER_SITE_OTHER): Payer: Self-pay | Admitting: Orthopaedic Surgery

## 2020-11-26 ENCOUNTER — Encounter: Payer: Self-pay | Admitting: Orthopaedic Surgery

## 2020-11-26 ENCOUNTER — Other Ambulatory Visit: Payer: Self-pay

## 2020-11-26 DIAGNOSIS — Z9889 Other specified postprocedural states: Secondary | ICD-10-CM | POA: Insufficient documentation

## 2020-11-26 DIAGNOSIS — G5602 Carpal tunnel syndrome, left upper limb: Secondary | ICD-10-CM

## 2020-11-26 DIAGNOSIS — M1811 Unilateral primary osteoarthritis of first carpometacarpal joint, right hand: Secondary | ICD-10-CM

## 2020-11-26 MED ORDER — METHYLPREDNISOLONE ACETATE 40 MG/ML IJ SUSP
13.3300 mg | INTRAMUSCULAR | Status: AC | PRN
  Administered 2020-11-26: 13.33 mg via INTRA_ARTICULAR

## 2020-11-26 MED ORDER — BUPIVACAINE HCL 0.5 % IJ SOLN
0.3300 mL | INTRAMUSCULAR | Status: AC | PRN
  Administered 2020-11-26: .33 mL via INTRA_ARTICULAR

## 2020-11-26 MED ORDER — LIDOCAINE HCL 1 % IJ SOLN
0.3000 mL | INTRAMUSCULAR | Status: AC | PRN
  Administered 2020-11-26: .3 mL

## 2020-11-26 MED ORDER — PROMETHAZINE HCL 25 MG PO TABS
25.0000 mg | ORAL_TABLET | Freq: Four times a day (QID) | ORAL | 1 refills | Status: DC | PRN
  Filled 2020-11-26: qty 10, 3d supply, fill #0

## 2020-11-26 NOTE — Progress Notes (Signed)
Office Visit Note   Patient: Julie Herman           Date of Birth: 1976/05/10           MRN: [id number] Visit Date: 11/26/2020              Requested by: Hoy Register, MD [address] [place],  [place] [address] PCP: Hoy Register, MD   Assessment & Plan: Visit Diagnoses:  1. Carpal tunnel syndrome on left   2. S/P carpal tunnel release   3. Primary osteoarthritis of first carpometacarpal joint of right hand     Plan: Sutures removed today for for the carpal tunnel incision.  I recommend wearing carpal tunnel splint for another couple weeks during activity.  For the right hand and CMC arthritis based on treatment options she elected for cortisone injection today.  She does describe weakness in her hand and wrist with activity therefore I think she would benefit from hand and wrist rehab for strengthening.  We will recheck her in 6 weeks for carpal tunnel release.  Today's encounter was performed through an interpreter.  Follow-Up Instructions: Return in about 6 weeks (around 01/07/2021).   Orders:  Orders Placed This Encounter  Procedures  . Ambulatory referral to Occupational Therapy   No orders of the defined types were placed in this encounter.     Procedures: Small Joint Inj: R thumb CMC on 11/26/2020 8:49 AM Indications: pain Details: 22 G needle Medications: 0.3 mL lidocaine 1 %; 0.33 mL bupivacaine 0.5 %; 13.33 mg methylPREDNISolone acetate 40 MG/ML Outcome: tolerated well, no immediate complications Patient was prepped and draped in the usual sterile fashion.       Clinical Data: No additional findings.   Subjective: Chief Complaint  Patient presents with  . Left Hand - Pain    Julie Herman is 2-week status post left carpal tunnel release.  Overall doing well has no complaints.  She is also here to be evaluated for right thumb CMC arthritis.  The pain is worse with hand use and activity.   Review of Systems  Constitutional: Negative.    HENT: Negative.   Eyes: Negative.   Respiratory: Negative.   Cardiovascular: Negative.   Endocrine: Negative.   Musculoskeletal: Negative.   Neurological: Negative.   Hematological: Negative.   Psychiatric/Behavioral: Negative.   All other systems reviewed and are negative.    Objective: Vital Signs: LMP 10/29/2020   Physical Exam Vitals and nursing note reviewed.  Constitutional:      Appearance: She is well-developed.  Pulmonary:     Effort: Pulmonary effort is normal.  Skin:    General: Skin is warm.     Capillary Refill: Capillary refill takes less than 2 seconds.  Neurological:     Mental Status: She is alert and oriented to person, place, and time.  Psychiatric:        Behavior: Behavior normal.        Thought Content: Thought content normal.        Judgment: Judgment normal.     Ortho Exam Left hand shows a fully healed surgical incision.  Lacks 5 mm of full opposition of the thumb tip to the fifth metacarpal head.  Neurovascular intact distally. Right hand shows pain with grind test.  No crepitus.  Range of motion is well-preserved. Specialty Comments:  No specialty comments available.  Imaging: No results found.   PMFS History: Patient Active Problem List   Diagnosis Date Noted  . S/P  carpal tunnel release 11/26/2020  . Primary osteoarthritis of first carpometacarpal joint of right hand 11/26/2020  . Carpal tunnel syndrome on left 11/12/2020  . Menorrhagia 04/10/2016  . Language barrier 09/14/2014  . Hyperlipidemia 04/10/2014   Past Medical History:  Diagnosis Date  . GERD (gastroesophageal reflux disease)   . Neuromuscular disorder (HCC)     Family History  Problem Relation Age of Onset  . Heart disease Mother   . Diabetes Brother   . Diabetes Maternal Aunt   . Diabetes Paternal Aunt     Past Surgical History:  Procedure Laterality Date  . CARPAL TUNNEL RELEASE Right 08/13/2020   Procedure: RIGHT CARPAL TUNNEL RELEASE;  Surgeon: Tarry Kos, MD;  Location: [facility];  Service: Orthopedics;  Laterality: Right;  Bier block  . CARPAL TUNNEL RELEASE Left 11/12/2020   Procedure: LEFT CARPAL TUNNEL RELEASE;  Surgeon: Tarry Kos, MD;  Location: [facility];  Service: Orthopedics;  Laterality: Left;  . NO PAST SURGERIES     Social History   Occupational History  . Not on file  Tobacco Use  . Smoking status: Never Smoker  . Smokeless tobacco: Never Used  Substance and Sexual Activity  . Alcohol use: Yes    Alcohol/week: 0.0 standard drinks  . Drug use: No  . Sexual activity: Yes    Birth control/protection: None

## 2020-12-03 ENCOUNTER — Other Ambulatory Visit: Payer: Self-pay

## 2021-01-07 ENCOUNTER — Encounter: Payer: Self-pay | Admitting: Orthopaedic Surgery

## 2021-01-07 ENCOUNTER — Ambulatory Visit (INDEPENDENT_AMBULATORY_CARE_PROVIDER_SITE_OTHER): Payer: Self-pay | Admitting: Orthopaedic Surgery

## 2021-01-07 DIAGNOSIS — Z9889 Other specified postprocedural states: Secondary | ICD-10-CM

## 2021-01-07 DIAGNOSIS — G5602 Carpal tunnel syndrome, left upper limb: Secondary | ICD-10-CM

## 2021-01-07 NOTE — Progress Notes (Signed)
   Post-Op Visit Note   Patient: Julie Herman           Date of Birth: [birth year]           MRN: [id number] Visit Date: 01/07/2021 PCP: Hoy Register, MD   Assessment & Plan:  Chief Complaint:  Chief Complaint  Patient presents with   Right Wrist - Routine Post Op   Visit Diagnoses:  1. S/P carpal tunnel release   2. Left carpal tunnel syndrome     Plan: Senai is 6 weeks status post left carpal tunnel release.  She is here for regular follow-up.  She endorses some muscle spasms in the palm as well as some tenderness near the surgical scar.  This is not constant.  She has noticed significant improvement in her carpal tunnel symptoms.  Interpreter present today.  Left hand shows a fully healed surgical scar without significant tenderness.  There is no swelling.  No neurovascular compromise.  Excellent thumb opposition, palmar abduction, abduction.  Reassurance was provided that it is normal to experience what she is feeling as a result of the surgery.  This will continue to improve.  She did very well from the right carpal tunnel release she is very happy with this.  If her symptoms do not improve over the next 6 weeks she should follow-up.    Follow-Up Instructions: Return if symptoms worsen or fail to improve.   Orders:  No orders of the defined types were placed in this encounter.  No orders of the defined types were placed in this encounter.   Imaging: No results found.  PMFS History: Patient Active Problem List   Diagnosis Date Noted   S/P carpal tunnel release 11/26/2020   Primary osteoarthritis of first carpometacarpal joint of right hand 11/26/2020   Carpal tunnel syndrome on left 11/12/2020   Menorrhagia 04/10/2016   Language barrier 09/14/2014   Hyperlipidemia 04/10/2014   Past Medical History:  Diagnosis Date   GERD (gastroesophageal reflux disease)    Neuromuscular disorder (HCC)     Family History  Problem Relation Age of Onset    Heart disease Mother    Diabetes Brother    Diabetes Maternal Aunt    Diabetes Paternal Aunt     Past Surgical History:  Procedure Laterality Date   CARPAL TUNNEL RELEASE Right 08/13/2020   Procedure: RIGHT CARPAL TUNNEL RELEASE;  Surgeon: Tarry Kos, MD;  Location: [facility];  Service: Orthopedics;  Laterality: Right;  Bier block   CARPAL TUNNEL RELEASE Left 11/12/2020   Procedure: LEFT CARPAL TUNNEL RELEASE;  Surgeon: Tarry Kos, MD;  Location: [facility];  Service: Orthopedics;  Laterality: Left;   NO PAST SURGERIES     Social History   Occupational History   Not on file  Tobacco Use   Smoking status: Never   Smokeless tobacco: Never  Substance and Sexual Activity   Alcohol use: Yes    Alcohol/week: 0.0 standard drinks   Drug use: No   Sexual activity: Yes    Birth control/protection: None

## 2021-01-20 ENCOUNTER — Telehealth: Payer: Self-pay | Admitting: Family Medicine

## 2021-01-20 NOTE — Telephone Encounter (Signed)
appt on 02-03-21 needs to be rescheduled for the afternoon pcp will be out that morning  

## 2021-02-03 ENCOUNTER — Ambulatory Visit: Payer: Self-pay | Admitting: Family Medicine

## 2021-02-10 ENCOUNTER — Other Ambulatory Visit: Payer: Self-pay

## 2021-02-10 ENCOUNTER — Encounter: Payer: Self-pay | Admitting: Physician Assistant

## 2021-02-10 ENCOUNTER — Ambulatory Visit: Payer: Self-pay | Attending: Family Medicine | Admitting: Physician Assistant

## 2021-02-10 DIAGNOSIS — E7800 Pure hypercholesterolemia, unspecified: Secondary | ICD-10-CM

## 2021-02-10 DIAGNOSIS — F4323 Adjustment disorder with mixed anxiety and depressed mood: Secondary | ICD-10-CM

## 2021-02-10 DIAGNOSIS — R632 Polyphagia: Secondary | ICD-10-CM

## 2021-02-10 DIAGNOSIS — Z789 Other specified health status: Secondary | ICD-10-CM

## 2021-02-10 DIAGNOSIS — R110 Nausea: Secondary | ICD-10-CM

## 2021-02-10 DIAGNOSIS — R42 Dizziness and giddiness: Secondary | ICD-10-CM

## 2021-02-10 MED ORDER — BUPROPION HCL ER (SR) 150 MG PO TB12
150.0000 mg | ORAL_TABLET | Freq: Two times a day (BID) | ORAL | 3 refills | Status: DC
  Filled 2021-02-10: qty 60, 30d supply, fill #0

## 2021-02-10 MED ORDER — PROMETHAZINE HCL 25 MG PO TABS
12.5000 mg | ORAL_TABLET | Freq: Four times a day (QID) | ORAL | 1 refills | Status: DC | PRN
  Filled 2021-02-10: qty 15, 8d supply, fill #0

## 2021-02-10 NOTE — Progress Notes (Signed)
Virtual Visit via Telephone Note  I connected with [name] on 02/10/21 at  8:50 AM EDT by telephone and verified that I am speaking with the correct person using two identifiers.  Location: Patient: home Provider: [facility] office [employer]   I discussed the limitations, risks, security and privacy concerns of performing an evaluation and management service by telephone and the availability of in person appointments. I also discussed with the patient that there may be a patient responsible charge related to this service. The patient expressed understanding and agreed to proceed.   History of Present Illness:  patient is complaining of nausea with occasional vomiting.  Admits to eating too much and having anxiety over eating.  Occasionally has dizziness.  She has not had vomiting in a while.  No urinary s/sx.  Some depression.  Denies SI/HI. Only taking omeprazole.  Not taking her cymbalta for anxiety/depression but is open to trying something different.  No abdominal pain.  No vision changes.  This has been going on for over 4 months.      Observations/Objective:  NAD.  A&Ox3   Assessment and Plan:   1. Nausea - Comprehensive metabolic panel; Future - Thyroid Panel With TSH; Future - promethazine (PHENERGAN) 25 MG tablet; Take 0.5 tablets (12.5 mg total) by mouth every 6 (six) hours as needed for nausea.  Dispense: 15 tablet; Refill: 1  2. Dizziness - Comprehensive metabolic panel; Future - Thyroid Panel With TSH; Future - Vitamin D, 25-hydroxy; Future - CBC with Differential/Platelet; Future  3. Pure hypercholesterolemia - Lipid panel; Future - Hemoglobin A1c; Future  4. Adjustment disorder with mixed anxiety and depressed mood - Thyroid Panel With TSH; Future - Vitamin D, 25-hydroxy; Future - buPROPion (WELLBUTRIN SR) 150 MG 12 hr tablet; Take 1 tablet (150 mg total) by mouth 2 (two) times daily.  Dispense: 60 tablet; Refill: 3  5. Increased  appetite - buPROPion (WELLBUTRIN SR) 150 MG 12 hr tablet; Take 1 tablet (150 mg total) by mouth 2 (two) times daily.  Dispense: 60 tablet; Refill: 3  6. Language barrier [employer] interpre[employer] used and additional time performing visit was required.     Follow Up Instructions: See PCP in 2 months   I discussed the assessment and treatment plan with the patient. The patient was provided an opportunity to ask questions and all were answered. The patient agreed with the plan and demonstrated an understanding of the instructions.   The patient was advised to call back or seek an in-person evaluation if the symptoms worsen or if the condition fails to improve as anticipated.  I provided 16 minutes of non-face-to-face time during this encounter.   Georgian Co, PA-C  Patient ID: Julie Herman, female   DOB: Nov 15, 1975, 45 y.o.   MRN: [id number]

## 2021-02-10 NOTE — Addendum Note (Signed)
Addended byMemory Dance on: 02/10/2021 09:08 AM   Modules accepted: Orders

## 2021-02-11 LAB — CBC WITH DIFFERENTIAL/PLATELET
Basophils Absolute: 0.1 10*3/uL (ref 0.0–0.2)
Basos: 1 %
EOS (ABSOLUTE): 0.2 10*3/uL (ref 0.0–0.4)
Eos: 1 %
Hematocrit: 33 % — ABNORMAL LOW (ref 34.0–46.6)
Hemoglobin: 10 g/dL — ABNORMAL LOW (ref 11.1–15.9)
Immature Grans (Abs): 0 10*3/uL (ref 0.0–0.1)
Immature Granulocytes: 0 %
Lymphocytes Absolute: 4.6 10*3/uL — ABNORMAL HIGH (ref 0.7–3.1)
Lymphs: 41 %
MCH: 22.4 pg — ABNORMAL LOW (ref 26.6–33.0)
MCHC: 30.3 g/dL — ABNORMAL LOW (ref 31.5–35.7)
MCV: 74 fL — ABNORMAL LOW (ref 79–97)
Monocytes Absolute: 0.6 10*3/uL (ref 0.1–0.9)
Monocytes: 5 %
Neutrophils Absolute: 5.8 10*3/uL (ref 1.4–7.0)
Neutrophils: 52 %
Platelets: 473 10*3/uL — ABNORMAL HIGH (ref 150–450)
RBC: 4.47 x10E6/uL (ref 3.77–5.28)
RDW: 13.9 % (ref 11.7–15.4)
WBC: 11.2 10*3/uL — ABNORMAL HIGH (ref 3.4–10.8)

## 2021-02-11 LAB — THYROID PANEL WITH TSH
Free Thyroxine Index: 1.6 (ref 1.2–4.9)
T3 Uptake Ratio: 23 % — ABNORMAL LOW (ref 24–39)
T4, Total: 7 ug/dL (ref 4.5–12.0)
TSH: 0.923 u[IU]/mL (ref 0.450–4.500)

## 2021-02-11 LAB — LIPID PANEL
Chol/HDL Ratio: 5.2 ratio — ABNORMAL HIGH (ref 0.0–4.4)
Cholesterol, Total: 183 mg/dL (ref 100–199)
HDL: 35 mg/dL — ABNORMAL LOW (ref >39)
LDL Chol Calc (NIH): 84 mg/dL (ref 0–99)
Triglycerides: 391 mg/dL — ABNORMAL HIGH (ref 0–149)
VLDL Cholesterol Cal: 64 mg/dL — ABNORMAL HIGH (ref 5–40)

## 2021-02-11 LAB — COMPREHENSIVE METABOLIC PANEL
ALT: 42 IU/L — ABNORMAL HIGH (ref 0–32)
AST: 39 IU/L (ref 0–40)
Albumin/Globulin Ratio: 1.4 (ref 1.2–2.2)
Albumin: 4.2 g/dL (ref 3.8–4.8)
Alkaline Phosphatase: 97 IU/L (ref 44–121)
BUN/Creatinine Ratio: 10 (ref 9–23)
BUN: 6 mg/dL (ref 6–24)
Bilirubin Total: 0.4 mg/dL (ref 0.0–1.2)
CO2: 22 mmol/L (ref 20–29)
Calcium: 9.6 mg/dL (ref 8.7–10.2)
Chloride: 101 mmol/L (ref 96–106)
Creatinine, Ser: 0.6 mg/dL (ref 0.57–1.00)
Globulin, Total: 3.1 g/dL (ref 1.5–4.5)
Glucose: 95 mg/dL (ref 65–99)
Potassium: 5.1 mmol/L (ref 3.5–5.2)
Sodium: 137 mmol/L (ref 134–144)
Total Protein: 7.3 g/dL (ref 6.0–8.5)
eGFR: 112 mL/min/{1.73_m2} (ref >59)

## 2021-02-11 LAB — VITAMIN D 25 HYDROXY (VIT D DEFICIENCY, FRACTURES): Vit D, 25-Hydroxy: 15.8 ng/mL — ABNORMAL LOW (ref 30.0–100.0)

## 2021-02-11 LAB — HEMOGLOBIN A1C
Est. average glucose Bld gHb Est-mCnc: 131 mg/dL
Hgb A1c MFr Bld: 6.2 % — ABNORMAL HIGH (ref 4.8–5.6)

## 2021-02-16 ENCOUNTER — Other Ambulatory Visit: Payer: Self-pay | Admitting: Physician Assistant

## 2021-02-16 ENCOUNTER — Other Ambulatory Visit: Payer: Self-pay

## 2021-02-16 DIAGNOSIS — E559 Vitamin D deficiency, unspecified: Secondary | ICD-10-CM

## 2021-02-16 DIAGNOSIS — D649 Anemia, unspecified: Secondary | ICD-10-CM

## 2021-02-16 DIAGNOSIS — R7303 Prediabetes: Secondary | ICD-10-CM

## 2021-02-16 MED ORDER — VITAMIN D (ERGOCALCIFEROL) 1.25 MG (50000 UNIT) PO CAPS
50000.0000 [IU] | ORAL_CAPSULE | ORAL | 0 refills | Status: DC
Start: 2021-02-16 — End: 2024-04-23
  Filled 2021-02-16 – 2021-02-25 (×2): qty 12, 84d supply, fill #0

## 2021-02-16 MED ORDER — METFORMIN HCL 500 MG PO TABS
500.0000 mg | ORAL_TABLET | Freq: Two times a day (BID) | ORAL | 3 refills | Status: DC
  Filled 2021-02-16 – 2021-02-25 (×2): qty 60, 30d supply, fill #0

## 2021-02-16 MED ORDER — FERROUS SULFATE 325 (65 FE) MG PO TABS
325.0000 mg | ORAL_TABLET | Freq: Every day | ORAL | 1 refills | Status: DC
Start: 2021-02-16 — End: 2024-04-23
  Filled 2021-02-16 – 2021-02-25 (×2): qty 30, 30d supply, fill #0

## 2021-02-17 ENCOUNTER — Other Ambulatory Visit (HOSPITAL_COMMUNITY): Payer: Self-pay

## 2021-02-23 ENCOUNTER — Other Ambulatory Visit: Payer: Self-pay

## 2021-02-24 ENCOUNTER — Telehealth: Payer: Self-pay | Admitting: *Deleted

## 2021-02-24 ENCOUNTER — Telehealth: Payer: Self-pay | Admitting: Family Medicine

## 2021-02-24 NOTE — Telephone Encounter (Signed)
Reviewed lab results and physician's note with the patient via interpreter [phone]. Discussed healthy foods and which foods to avoid. Talked about increasing her daily water intake, add walking to her day. She will pick up medications and begin taking immediately. She will discuss the VitD recheck at her October OV.

## 2021-02-24 NOTE — Telephone Encounter (Signed)
Call placed to patient and VM was left informing patient to return phone call for lab results. 

## 2021-02-24 NOTE — Telephone Encounter (Signed)
Copied from CRM [id number]. Topic: General - Call Back - No Documentation >> Feb 23, 2021  1:17 PM Leafy Ro wrote: reason for CRM: Pt is calling and would like blood work results from 02-10-2021 per spanish interpreters fabio ID [phone]

## 2021-02-25 ENCOUNTER — Other Ambulatory Visit: Payer: Self-pay

## 2021-04-05 ENCOUNTER — Ambulatory Visit: Payer: Self-pay | Admitting: Family Medicine

## 2021-06-15 ENCOUNTER — Other Ambulatory Visit: Payer: Self-pay

## 2021-06-15 ENCOUNTER — Encounter: Payer: Self-pay | Admitting: Family Medicine

## 2021-06-15 ENCOUNTER — Ambulatory Visit: Payer: Self-pay | Attending: Family Medicine | Admitting: Family Medicine

## 2021-06-15 VITALS — BP 115/77 | HR 81 | Ht 61.0 in | Wt 155.1 lb

## 2021-06-15 DIAGNOSIS — R112 Nausea with vomiting, unspecified: Secondary | ICD-10-CM

## 2021-06-15 DIAGNOSIS — E781 Pure hyperglyceridemia: Secondary | ICD-10-CM

## 2021-06-15 DIAGNOSIS — Z1159 Encounter for screening for other viral diseases: Secondary | ICD-10-CM

## 2021-06-15 DIAGNOSIS — R7303 Prediabetes: Secondary | ICD-10-CM

## 2021-06-15 DIAGNOSIS — Z1211 Encounter for screening for malignant neoplasm of colon: Secondary | ICD-10-CM

## 2021-06-15 NOTE — Patient Instructions (Signed)
Nuseas, en adultos Nausea, Adult Las nuseas se describen como la sensacin de Dentist en el estmago o de que se est por vomitar. Las nuseas en s a menudo no constituyen Chief Technology Officer, pero pueden ser un signo temprano de problemas mdicos ms graves. Si las nuseas empeoran, pueden provocar vmitos. Si los vmitos empeoran o si usted no puede beber suficiente lquido, corre Nurse, adult de deshidratarse. La deshidratacin puede causarle cansancio, sed, sequedad en la boca y disminucin en la frecuencia con la que orina. Los [employer] y las personas que tienen otras enfermedades o un sistema de defensa (sistema inmunitario) dbil tienen mayor riesgo de sufrir deshidratacin. Los [employer] del tratamiento de las nuseas son los siguientes: Aliviar sus nuseas. Restringir los episodios reiterados de nuseas. Prevenir los vmitos y la deshidratacin. Siga estas indicaciones en su casa: Controle sus sntomas para detectar cualquier cambio. Informe al mdico acerca de los cambios. Qu debe comer y beber   Tome una solucin de rehidratacin oral (SRO). Esta es una bebida que se vende en farmacias y tiendas minoristas. En la medida en que pueda, beba lquidos transparentes lentamente y en pequeas cantidades. Los lquidos transparentes incluyen agua, cubitos de hielo, [place] deportivas bajas en caloras y [place] de fruta rebajado con agua (jugo de fruta diluido). En la medida en que pueda, consuma alimentos blandos y fciles de digerir en pequeas cantidades. Estos alimentos incluyen bananas, compota de [place], arroz, carnes [place], tostadas y [address]. Evite consumir lquidos que contengan mucha azcar o cafena, como bebidas energticas, bebidas deportivas y refrescos. Evite tomar alcohol. Evite los alimentos condimentados o con alto contenido de [place]. Indicaciones generales Use los medicamentos de venta libre y los recetados solamente como se lo haya indicado  el mdico. Descanse en su casa mientras se recupera. Beba suficiente lquido como para Pharmacologist la orina de color amarillo plido. Cuando sienta nuseas, respire lenta y profundamente. Evite oler cosas que tengan olores fuertes. Lvese las manos frecuentemente con agua y jabn durante al menos 20 segundos. Use desinfectante para manos si no dispone de [place] y [place]. Asegrese de que en su hogar todos se laven las manos bien y con frecuencia. Concurra a todas las visitas de seguimiento. Esto es importante. Comunquese con un mdico si: Las nuseas empeoran. Las nuseas no desaparecen despus de [address]. Vomita muchas veces. No puede beber lquidos sin vomitar. Tiene alguno de los siguientes sntomas: Sntomas nuevos. Grant Ruts. Dolor de [place]. Calambres musculares. Erupcin cutnea. Dolor al Beatrix Shipper. Se siente aturdido o mareado. Solicite ayuda de inmediato si: [employer], el cuello, los brazos o la [place]. Se siente muy dbil o se desmaya. Vomita y el vmito es de color rojo intenso o se asemeja al poso del caf. Tiene heces (deposiciones) negras o sanguinolentas, o heces de aspecto alquitranado. Siente dolor de cabeza intenso, rigidez en el cuello, o ambas cosas. Tiene dolor intenso, clicos o distensin abdominal. Tiene dificultades para respirar o respira muy rpido. Su corazn late muy rpidamente. Siente la piel fra y hmeda. Se siente confundido. Tiene signos de deshidratacin, como los siguientes: Orina de color oscuro, muy escasa o falta de [place]. Labios agrietados. Sequedad de boca. Ojos hundidos. Somnolencia. Debilidad. Estos sntomas pueden Customer service manager. Solicite ayuda de inmediato. Llame al 911. No espere a ver si los sntomas desaparecen. No conduzca por sus propios medios [employer] [facility]. Resumen Las nuseas se describen como la sensacin de Dentist en el estmago o de que se est por  vomitar. Las nuseas en s a menudo no  constituyen Chief Technology Officer, pero pueden ser un signo temprano de problemas mdicos ms graves. Si los vmitos empeoran o si usted no puede beber suficiente lquido, corre Nurse, adult de deshidratarse. Siga las recomendaciones sobre qu debe comer y beber y use los medicamentos de venta libre y recetados solamente como se lo haya indicado el mdico. Comunquese con un mdico de inmediato si los sntomas empeoran o si presenta nuevos sntomas. Concurra a todas las visitas de seguimiento. Esto es importante. Esta informacin no tiene Theme park manager el consejo del mdico. Asegrese de hacerle al mdico cualquier pregunta que tenga. Document Revised: 01/20/2021 Document Reviewed: 01/20/2021 Elsevier Patient Education  2022 [employer].

## 2021-06-15 NOTE — Progress Notes (Signed)
Subjective:  Patient ID: Julie Herman, female    DOB: 09/18/1975  Age: 45 y.o. MRN: [id number]  CC: Medication Problem   HPI Shakendra Griffeth Durenda Hurt is a 45 y.o. year old female with a history of Prediabetes, hypertriglyceridemia.  Interval History: She would like to have a repeat blood work as she has been having vomiting, nausea for 6 months and this occurs when she eats.  Denies presence of abdominal pain, constipation or diarrhea. She should be on Metformin for Prediabetes but she discontinued it due to it causing headaches. She is on omega-3 capsules for hypertriglyceridemia. Past Medical History:  Diagnosis Date   GERD (gastroesophageal reflux disease)    Neuromuscular disorder [facility])     Past Surgical History:  Procedure Laterality Date   CARPAL TUNNEL RELEASE Right 08/13/2020   Procedure: RIGHT CARPAL TUNNEL RELEASE;  Surgeon: Leandrew Koyanagi, MD;  Location: [place];  Service: Orthopedics;  Laterality: Right;  Bier block   CARPAL TUNNEL RELEASE Left 11/12/2020   Procedure: LEFT CARPAL TUNNEL RELEASE;  Surgeon: Leandrew Koyanagi, MD;  Location: [place];  Service: Orthopedics;  Laterality: Left;   NO PAST SURGERIES      Family History  Problem Relation Age of Onset   Heart disease Mother    Diabetes Brother    Diabetes Maternal Aunt    Diabetes Paternal Aunt     No Known Allergies  Outpatient Medications Prior to Visit  Medication Sig Dispense Refill   buPROPion (WELLBUTRIN SR) 150 MG 12 hr tablet Take 1 tablet (150 mg total) by mouth 2 (two) times daily. 60 tablet 3   ferrous sulfate 325 (65 FE) MG tablet Take 1 tablet (325 mg total) by mouth daily. 100 tablet 1   loratadine (CLARITIN) 10 MG tablet Take 10 mg by mouth daily.     promethazine (PHENERGAN) 25 MG tablet Take 0.5 tablets (12.5 mg total) by mouth every 6 (six) hours as needed for nausea. 15 tablet 1   Vitamin D, Ergocalciferol, (DRISDOL) 1.25 MG (50000  UNIT) CAPS capsule Take 1 capsule (50,000 Units total) by mouth every 7 (seven) days. 16 capsule 0   omeprazole (PRILOSEC) 20 MG capsule TAKE 1 CAPSULE (20 MG TOTAL) BY MOUTH IN THE MORNING AND AT BEDTIME. TO REDUCE STOMACH ACID 60 capsule 3   metFORMIN (GLUCOPHAGE) 500 MG tablet Take 1 tablet (500 mg total) by mouth 2 (two) times daily with a meal. (Patient not taking: Reported on 06/15/2021) 180 tablet 3   No facility-administered medications prior to visit.     ROS Review of Systems  Constitutional:  Negative for activity change, appetite change and fatigue.  HENT:  Negative for congestion, sinus pressure and sore throat.   Eyes:  Negative for visual disturbance.  Respiratory:  Negative for cough, chest tightness, shortness of breath and wheezing.   Cardiovascular:  Negative for chest pain and palpitations.  Gastrointestinal:  Positive for nausea and vomiting. Negative for abdominal distention, abdominal pain and constipation.  Endocrine: Negative for polydipsia.  Genitourinary:  Negative for dysuria and frequency.  Musculoskeletal:  Negative for arthralgias and back pain.  Skin:  Negative for rash.  Neurological:  Negative for tremors, light-headedness and numbness.  Hematological:  Does not bruise/bleed easily.  Psychiatric/Behavioral:  Negative for agitation and behavioral problems.    Objective:  BP 115/77    Pulse 81    Ht 5' 1" (1.549 m)    Wt 155 lb 2 oz (  70.4 kg)    LMP 06/01/2021    SpO2 99%    BMI 29.31 kg/m   BP/Weight 06/15/2021 11/19/2020 03/26/2682  Systolic BP 419 - 622  Diastolic BP 77 - 65  Wt. (Lbs) 155.13 167 167.77  BMI 29.31 31.55 31.7      Physical Exam Constitutional:      Appearance: She is well-developed.  Cardiovascular:     Rate and Rhythm: Normal rate.     Heart sounds: Normal heart sounds. No murmur heard. Pulmonary:     Effort: Pulmonary effort is normal.     Breath sounds: Normal breath sounds. No wheezing or rales.  Chest:     Chest  wall: No tenderness.  Abdominal:     General: Bowel sounds are normal. There is no distension.     Palpations: Abdomen is soft. There is no mass.     Tenderness: There is no abdominal tenderness.  Musculoskeletal:        General: Normal range of motion.     Right lower leg: No edema.     Left lower leg: No edema.  Neurological:     Mental Status: She is alert and oriented to person, place, and time.  Psychiatric:        Mood and Affect: Mood normal.    CMP Latest Ref Rng & Units 02/10/2021 05/14/2020 11/16/2014  Glucose 65 - 99 mg/dL 95 94 66(L)  BUN 6 - 24 mg/dL _0 Creatinine 0.57 - 1.00 mg/dL 0.60 0.54(L) 0.55  Sodium 134 - 144 mmol/L 137 137 139  Potassium 3.5 - 5.2 mmol/L 5.1 4.3 3.7  Chloride 96 - 106 mmol/L 101 101 104  CO2 20 - 29 mmol/L _1 Calcium 8.7 - 10.2 mg/dL 9.6 9.7 8.9  Total Protein 6.0 - 8.5 g/dL 7.3 7.3 7.0  Total Bilirubin 0.0 - 1.2 mg/dL 0.4 0.3 0.4  Alkaline Phos 44 - 121 IU/L 97 93 99  AST 0 - 40 IU/L 39 31 22  ALT 0 - 32 IU/L 42(H) 37(H) 18    Lipid Panel     Component Value Date/Time   CHOL 183 02/10/2021 0910   TRIG 391 (H) 02/10/2021 0910   HDL 35 (L) 02/10/2021 0910   CHOLHDL 5.2 (H) 02/10/2021 0910   CHOLHDL 4.0 08/01/2014 1118   VLDL 41 (H) 08/01/2014 1118   LDLCALC 84 02/10/2021 0910    CBC    Component Value Date/Time   WBC 11.2 (H) 02/10/2021 0910   WBC 10.5 04/10/2016 1435   RBC 4.47 02/10/2021 0910   RBC 4.84 04/10/2016 1435   HGB 10.0 (L) 02/10/2021 0910   HCT 33.0 (L) 02/10/2021 0910   PLT 473 (H) 02/10/2021 0910   MCV 74 (L) 02/10/2021 0910   MCH 22.4 (L) 02/10/2021 0910   MCH 24.2 (L) 04/10/2016 1435   MCHC 30.3 (L) 02/10/2021 0910   MCHC 32.0 04/10/2016 1435   RDW 13.9 02/10/2021 0910   LYMPHSABS 4.6 (H) 02/10/2021 0910   EOSABS 0.2 02/10/2021 0910   BASOSABS 0.1 02/10/2021 0910    Lab Results  Component Value Date   HGBA1C 6.2 (H) 02/10/2021    Assessment & Plan:  1. Nausea and vomiting, unspecified  vomiting type Will need to exclude H. pylori gastritis - CMP14+EGFR - CBC with Differential/Platelet - H. pylori breath test  2. Hypertriglyceridemia Slightly elevated triglycerides Continue with OTC omega-3 capsules  3. Prediabetes A1c 6.2 Unable to tolerate metformin hence I have discontinued it -  Hemoglobin A1c  4. Need for hepatitis C screening test - HCV Ab w Reflex to Quant PCR  5. Screening for colon cancer - Fecal occult blood, imunochemical([facility])    No orders of the defined types were placed in this encounter.   Follow-up: Return in about 6 months (around 12/14/2021) for Chronic medical conditions.       Charlott Rakes, MD, FAAFP. [facility] and [facility] [place], [place]   06/15/2021, 5:44 PM

## 2021-06-16 LAB — CBC WITH DIFFERENTIAL/PLATELET
Basophils Absolute: 0 10*3/uL (ref 0.0–0.2)
Basos: 0 %
EOS (ABSOLUTE): 0.2 10*3/uL (ref 0.0–0.4)
Eos: 1 %
Hematocrit: 33.7 % — ABNORMAL LOW (ref 34.0–46.6)
Hemoglobin: 10.7 g/dL — ABNORMAL LOW (ref 11.1–15.9)
Immature Grans (Abs): 0 10*3/uL (ref 0.0–0.1)
Immature Granulocytes: 0 %
Lymphocytes Absolute: 4.3 10*3/uL — ABNORMAL HIGH (ref 0.7–3.1)
Lymphs: 36 %
MCH: 23.8 pg — ABNORMAL LOW (ref 26.6–33.0)
MCHC: 31.8 g/dL (ref 31.5–35.7)
MCV: 75 fL — ABNORMAL LOW (ref 79–97)
Monocytes Absolute: 0.8 10*3/uL (ref 0.1–0.9)
Monocytes: 7 %
Neutrophils Absolute: 6.7 10*3/uL (ref 1.4–7.0)
Neutrophils: 56 %
Platelets: 403 10*3/uL (ref 150–450)
RBC: 4.49 x10E6/uL (ref 3.77–5.28)
RDW: 14.4 % (ref 11.7–15.4)
WBC: 12.1 10*3/uL — ABNORMAL HIGH (ref 3.4–10.8)

## 2021-06-16 LAB — CMP14+EGFR
ALT: 34 IU/L — ABNORMAL HIGH (ref 0–32)
AST: 30 IU/L (ref 0–40)
Albumin/Globulin Ratio: 1.3 (ref 1.2–2.2)
Albumin: 4.2 g/dL (ref 3.8–4.8)
Alkaline Phosphatase: 96 IU/L (ref 44–121)
BUN/Creatinine Ratio: 10 (ref 9–23)
BUN: 7 mg/dL (ref 6–24)
Bilirubin Total: 0.3 mg/dL (ref 0.0–1.2)
CO2: 23 mmol/L (ref 20–29)
Calcium: 8.7 mg/dL (ref 8.7–10.2)
Chloride: 104 mmol/L (ref 96–106)
Creatinine, Ser: 0.7 mg/dL (ref 0.57–1.00)
Globulin, Total: 3.3 g/dL (ref 1.5–4.5)
Glucose: 60 mg/dL — ABNORMAL LOW (ref 70–99)
Potassium: 4.5 mmol/L (ref 3.5–5.2)
Sodium: 140 mmol/L (ref 134–144)
Total Protein: 7.5 g/dL (ref 6.0–8.5)
eGFR: 108 mL/min/{1.73_m2} (ref >59)

## 2021-06-16 LAB — HEMOGLOBIN A1C
Est. average glucose Bld gHb Est-mCnc: 137 mg/dL
Hgb A1c MFr Bld: 6.4 % — ABNORMAL HIGH (ref 4.8–5.6)

## 2021-06-16 LAB — HCV AB W REFLEX TO QUANT PCR: HCV Ab: <0.1 s/co ratio (ref 0.0–0.9)

## 2021-06-16 LAB — H. PYLORI BREATH TEST: H pylori Breath Test: NEGATIVE

## 2021-06-16 LAB — HCV INTERPRETATION

## 2021-06-17 ENCOUNTER — Telehealth: Payer: Self-pay

## 2021-06-17 NOTE — Telephone Encounter (Signed)
-----   Message from Hoy Register, MD sent at 06/16/2021  1:20 PM EST ----- Please inform her that her A1c is 6.4 which is up from 6.2 previously and she has a diagnosis of prediabetes which is very close to diabetes.  Since she is unable to tolerate metformin she needs to work cutting out carbs, cutting out sweets and exercising to prevent developing diabetes mellitus.  She is also anemic and this appears to be chronic.  Please advised to take OTC iron supplements.

## 2021-06-17 NOTE — Telephone Encounter (Signed)
-----   Message from Hoy Register, MD sent at 06/17/2021 10:04 AM EST ----- Please inform her that her H. pylori bacteria test is negative.  She has a prescription for omeprazole at the pharmacy and I would recommend she takes that first thing in the morning before her meal as that might help with her GI symptoms.

## 2021-06-17 NOTE — Telephone Encounter (Signed)
Patient name and DOB has been verified Patient was informed of lab results. Patient had no questions.  

## 2021-06-20 ENCOUNTER — Other Ambulatory Visit: Payer: Self-pay

## 2021-09-27 ENCOUNTER — Ambulatory Visit (INDEPENDENT_AMBULATORY_CARE_PROVIDER_SITE_OTHER): Payer: Self-pay

## 2021-09-27 ENCOUNTER — Encounter: Payer: Self-pay | Admitting: Orthopaedic Surgery

## 2021-09-27 ENCOUNTER — Other Ambulatory Visit: Payer: Self-pay

## 2021-09-27 ENCOUNTER — Ambulatory Visit (INDEPENDENT_AMBULATORY_CARE_PROVIDER_SITE_OTHER): Payer: Self-pay | Admitting: Orthopaedic Surgery

## 2021-09-27 VITALS — Ht 61.0 in | Wt 152.0 lb

## 2021-09-27 DIAGNOSIS — G8929 Other chronic pain: Secondary | ICD-10-CM

## 2021-09-27 DIAGNOSIS — M25561 Pain in right knee: Secondary | ICD-10-CM

## 2021-09-27 MED ORDER — LIDOCAINE HCL 1 % IJ SOLN
2.0000 mL | INTRAMUSCULAR | Status: AC | PRN
  Administered 2021-09-27: 2 mL

## 2021-09-27 MED ORDER — BUPIVACAINE HCL 0.25 % IJ SOLN
2.0000 mL | INTRAMUSCULAR | Status: AC | PRN
  Administered 2021-09-27: 2 mL via INTRA_ARTICULAR

## 2021-09-27 MED ORDER — METHYLPREDNISOLONE ACETATE 40 MG/ML IJ SUSP
40.0000 mg | INTRAMUSCULAR | Status: AC | PRN
  Administered 2021-09-27: 40 mg via INTRA_ARTICULAR

## 2021-09-27 NOTE — Progress Notes (Signed)
? ?Office Visit Note ?  ?Patient: Julie Herman           ?Date of Birth: [birth year]           ?MRN: [id number] ?Visit Date: 09/27/2021 ?             ?Requested by: Hoy Register, MD ?[address] [place] ?[address] ?[place],  [place] [address] ?PCP: Hoy Register, MD ? ? ?Assessment & Plan: ?Visit Diagnoses:  ?1. Chronic pain of right knee   ? ? ?Plan: Impression is chronic right knee pain concerning for degenerative medial meniscus tear.  Today, we discussed proceeding with cortisone injection versus MRI.  She would like to try cortisone injection first.  If her symptoms have not improved over the next several weeks she will call we will order an MRI to assess for meniscal pathology.  Otherwise follow-up with [place] as needed. ? ?Follow-Up Instructions: Return if symptoms worsen or fail to improve.  ? ?Orders:  ?Orders Placed This Encounter  ?Procedures  ? Large Joint Inj: R knee  ? XR KNEE 3 VIEW RIGHT  ? ?No orders of the defined types were placed in this encounter. ? ? ? ? Procedures: ?Large Joint Inj: R knee on 09/27/2021 10:49 AM ?Indications: pain ?Details: 22 G needle, anterolateral approach ?Medications: 2 mL lidocaine 1 %; 2 mL bupivacaine 0.25 %; 40 mg methylPREDNISolone acetate 40 MG/ML ? ? ? ? ?Clinical Data: ?No additional findings. ? ? ?Subjective: ?Chief Complaint  ?Patient presents with  ? Right Knee - Pain  ? ? ?HPI patient is a pleasant 46 year old Spanish-speaking female who is here today with an interpreter.  She is here with right knee pain for the past 6 months.  She denies any injury or change in activity.  The pain was initially intermittent but has now become more constant.  It is located primarily to the anteromedial aspect.  She denies any pain at rest.  She does have increased pain with flexion of the knee as well as with increased activity such as running.  She notes occasional locking and popping.  She has tried over-the-counter pain medication without relief.  No previous  cortisone injection to the right knee. ? ?Review of Systems as detailed in HPI.  All others reviewed are negative. ? ? ?Objective: ?Vital Signs: Ht 5\' 1"  (1.549 m)   Wt 152 lb (68.9 kg)   BMI 28.72 kg/m?  ? ?Physical Exam well-developed well-nourished female in no acute distress.  Alert and oriented x3. ? ?Ortho Exam right knee exam shows trace effusion.  Range of motion 0 to 120 degrees.  She does have mild patellofemoral crepitus.  Anteromedial joint line tenderness.  Ligaments are stable.  She is neurovascular intact distally. ? ?Specialty Comments:  ?No specialty comments available. ? ?Imaging: ?XR KNEE 3 VIEW RIGHT ? ?Result Date: 09/27/2021 ?X-rays demonstrate mild to moderate degenerative changes to the medial compartment  ? ? ?PMFS History: ?Patient Active Problem List  ? Diagnosis Date Noted  ? S/P carpal tunnel release 11/26/2020  ? Primary osteoarthritis of first carpometacarpal joint of right hand 11/26/2020  ? Carpal tunnel syndrome on left 11/12/2020  ? Menorrhagia 04/10/2016  ? Language barrier 09/14/2014  ? Hyperlipidemia 04/10/2014  ? ?Past Medical History:  ?Diagnosis Date  ? GERD (gastroesophageal reflux disease)   ? Neuromuscular disorder (HCC)   ?  ?Family History  ?Problem Relation Age of Onset  ? Heart disease Mother   ? Diabetes Brother   ? Diabetes  Maternal Aunt   ? Diabetes Paternal Aunt   ?  ?Past Surgical History:  ?Procedure Laterality Date  ? CARPAL TUNNEL RELEASE Right 08/13/2020  ? Procedure: RIGHT CARPAL TUNNEL RELEASE;  Surgeon: Tarry Kos, MD;  Location: [facility];  Service: Orthopedics;  Laterality: Right;  Bier block  ? CARPAL TUNNEL RELEASE Left 11/12/2020  ? Procedure: LEFT CARPAL TUNNEL RELEASE;  Surgeon: Tarry Kos, MD;  Location: [facility];  Service: Orthopedics;  Laterality: Left;  ? NO PAST SURGERIES    ? ?Social History  ? ?Occupational History  ? Not on file  ?Tobacco Use  ? Smoking status: Never  ? Smokeless tobacco: Never   ?Substance and Sexual Activity  ? Alcohol use: Yes  ?  Alcohol/week: 0.0 standard drinks  ? Drug use: No  ? Sexual activity: Yes  ?  Birth control/protection: None  ? ? ? ? ? ? ?

## 2024-03-12 ENCOUNTER — Telehealth: Payer: Self-pay

## 2024-03-12 NOTE — Telephone Encounter (Signed)
 New pt. Transferring care to you in Oct.   Copied from CRM #[id number]. Topic: Appointments - Transfer of Care >> Mar 12, 2024 10:55 AM Charlet HERO wrote: Pt is requesting to transfer FROM: [facility] Pt is requesting to transfer TO: [facility] Reason for requested transfer: None given It is the responsibility of the team the patient would like to transfer to (Dr. Reyne Bustle) to reach out to the patient if for any reason this transfer is not acceptable.

## 2024-04-02 ENCOUNTER — Ambulatory Visit: Payer: Self-pay | Admitting: Family Medicine

## 2024-04-02 ENCOUNTER — Encounter: Payer: Self-pay | Admitting: Family Medicine

## 2024-04-02 ENCOUNTER — Other Ambulatory Visit: Payer: Self-pay

## 2024-04-02 VITALS — BP 130/80 | HR 74 | Ht 61.0 in | Wt 164.6 lb

## 2024-04-02 DIAGNOSIS — R7303 Prediabetes: Secondary | ICD-10-CM

## 2024-04-02 DIAGNOSIS — Z7689 Persons encountering health services in other specified circumstances: Secondary | ICD-10-CM | POA: Diagnosis not present

## 2024-04-02 DIAGNOSIS — M170 Bilateral primary osteoarthritis of knee: Secondary | ICD-10-CM

## 2024-04-02 LAB — POCT GLYCOSYLATED HEMOGLOBIN (HGB A1C): Hemoglobin A1C: 6.1 % — AB (ref 4.0–5.6)

## 2024-04-02 MED ORDER — CELECOXIB 100 MG PO CAPS
100.0000 mg | ORAL_CAPSULE | Freq: Two times a day (BID) | ORAL | 0 refills | Status: AC
  Filled 2024-04-02: qty 60, 30d supply, fill #0

## 2024-04-02 MED ORDER — TRAZODONE HCL 50 MG PO TABS
25.0000 mg | ORAL_TABLET | Freq: Every evening | ORAL | 3 refills | Status: AC | PRN
  Filled 2024-04-02: qty 30, 30d supply, fill #0

## 2024-04-02 NOTE — Patient Instructions (Signed)
 Next time you have any itchiness, I'd like for you to use clotrimazole and muporicin together over the area.   CLOTRIMAZOLE  MUPORICIN

## 2024-04-02 NOTE — Progress Notes (Signed)
 Name: Julie Herman   Date of Visit: 04/02/24   Date of last visit with me: Visit date not found   CHIEF COMPLAINT:  Chief Complaint  Patient presents with   Establish Care    Establish care. Knee problema, can't really sleep or walk, aches all day and all night.        HPI:  Discussed the use of AI scribe software for clinical note transcription with the patient, who gave verbal consent to proceed.  History of Present Illness   Julie Herman is a 48 year old female with arthritis who presents with chronic knee pain.  She has been experiencing knee pain for over a year, initially evaluated by a specialist in [place] who diagnosed three meniscus tears. The pain is significant, affecting her sleep and daily activities, and she describes a sensation of instability, feeling as if 'the bone is slipping'.  She has received three steroid injections in 2023, which provided temporary relief for about a week. A gel injection was administered in July 2025 in [place] but pain and instability persist. Various medications, including meloxicam , diclofenac , and tramadol, have been tried, offering relaxation but not significant pain relief. A knee brace has been used without effectiveness, and she believes it may have worsened her condition.  She experiences headaches and backaches for the past two weeks, which are persistent and sometimes severe, affecting her ability to open her eyes. Ibuprofen  is taken twice daily, providing only temporary relief.  She has gained weight due to inability to exercise because of knee pain. She experiences hot flashes, attributed to menopause, as she no longer has menstrual cycles following a hysterectomy.         OBJECTIVE:       04/02/2024   11:26 AM  Depression screen PHQ 2/9  Decreased Interest 0  Down, Depressed, Hopeless 0  PHQ - 2 Score 0     BP Readings from Last 3 Encounters:  04/02/24 130/80  06/15/21  115/77  11/12/20 113/65    BP 130/80   Pulse 74   Ht 5' 1 (1.549 m)   Wt 164 lb 9.6 oz (74.7 kg)   SpO2 97%   BMI 31.10 kg/m    Physical Exam   MUSCULOSKELETAL: No swelling of the right knee, TTP over the medial joint area. ROM full.      Physical Exam  ASSESSMENT/PLAN:   Assessment & Plan Primary osteoarthritis of both knees  Pre-diabetes  Sleep concern    Assessment and Plan    Right knee osteoarthritis with meniscus tear and chronic pain Chronic right knee pain with meniscus tear and osteoarthritis. Previous injections provided limited relief. Reports worsening pain and instability. Surgery deferred due to age and potential future knee replacement. Conservative management prioritized. - Prescribe Celebrex 200 mg, one pill twice daily. - Advise obtaining a compression sleeve without a hole for the knee. - Schedule follow-up in three weeks for ultrasound evaluation of the meniscus. - Discuss potential for future surgery if conservative measures fail.  Migraine headache, recurrent Recurrent migraines for the past two weeks. Current ibuprofen  regimen is insufficient. - Monitor response to Celebrex for migraine relief. - Consider migraine-specific medication if no improvement by next visit.  Prediabetes Blood tests indicate prediabetes. Weight gain likely due to decreased physical activity from knee pain. - Advise dietary modifications to reduce caloric intake.  Postmenopausal state (s/p hysterectomy) with vasomotor symptoms Experiencing vasomotor symptoms including hot flashes and sleep disturbances following  hysterectomy. - Consider medication for sleep if symptoms persist.         Field Staniszewski A. Vita MD [facility]

## 2024-04-03 NOTE — Addendum Note (Signed)
 Addended by: LATTIE CARLO BROCKS on: 04/03/2024 08:24 AM   Modules accepted: Orders

## 2024-04-21 ENCOUNTER — Encounter: Payer: Self-pay | Admitting: Family Medicine

## 2024-04-23 ENCOUNTER — Ambulatory Visit: Payer: Self-pay

## 2024-04-23 ENCOUNTER — Ambulatory Visit: Admitting: Family Medicine

## 2024-04-23 ENCOUNTER — Other Ambulatory Visit: Payer: Self-pay

## 2024-04-23 ENCOUNTER — Encounter: Payer: Self-pay | Admitting: Family Medicine

## 2024-04-23 VITALS — BP 124/82 | HR 88 | Ht 61.0 in | Wt 167.7 lb

## 2024-04-23 DIAGNOSIS — S83206A Unspecified tear of unspecified meniscus, current injury, right knee, initial encounter: Secondary | ICD-10-CM | POA: Diagnosis not present

## 2024-04-23 DIAGNOSIS — F4323 Adjustment disorder with mixed anxiety and depressed mood: Secondary | ICD-10-CM

## 2024-04-23 DIAGNOSIS — M170 Bilateral primary osteoarthritis of knee: Secondary | ICD-10-CM

## 2024-04-23 DIAGNOSIS — R7303 Prediabetes: Secondary | ICD-10-CM

## 2024-04-23 DIAGNOSIS — R632 Polyphagia: Secondary | ICD-10-CM | POA: Diagnosis not present

## 2024-04-23 DIAGNOSIS — E782 Mixed hyperlipidemia: Secondary | ICD-10-CM

## 2024-04-23 MED ORDER — EMPAGLIFLOZIN 25 MG PO TABS
25.0000 mg | ORAL_TABLET | Freq: Every day | ORAL | 1 refills | Status: DC
  Filled 2024-04-23: qty 30, 30d supply, fill #0

## 2024-04-23 MED ORDER — ROSUVASTATIN CALCIUM 20 MG PO TABS
20.0000 mg | ORAL_TABLET | Freq: Every day | ORAL | 1 refills | Status: AC
  Filled 2024-04-23: qty 90, 90d supply, fill #0
  Filled 2024-07-16: qty 90, 90d supply, fill #1

## 2024-04-23 NOTE — Progress Notes (Signed)
 Name: Julie Herman   Date of Visit: 04/23/24   Date of last visit with me: 04/02/2024   CHIEF COMPLAINT:  Chief Complaint  Patient presents with   Follow-up    3 week follow up.       HPI:  Discussed the use of AI scribe software for clinical note transcription with the patient, who gave verbal consent to proceed.  History of Present Illness   Julie Herman is a 48 year old female who presents with persistent knee pain.  She has persistent right knee pain. The pain is constant, affecting her both day and night, and has not been alleviated by previous treatments. She has been using a compression sleeve, which she forgot to bring today, and reports it has been somewhat helpful.  She is taking medication prescribed for inflammation and sleep, but these have not provided relief. She received a steroid injection approximately two years ago, which did not provide lasting relief, and she prefers to wait for surgery rather than receive another injection.  She is currently taking medication for inflammation and sleep, but not for cholesterol or prediabetes, as the pharmacy did not provide these. She experienced temporary relief from knee pain medication for about a week, but the pain has since returned.  She reports experiencing anxiety related to food, feeling hungry shortly after eating, which has led to weight gain. She is not aware of having anxiety or depression but notes this pattern of eating.         OBJECTIVE:       04/02/2024   11:26 AM  Depression screen PHQ 2/9  Decreased Interest 0  Down, Depressed, Hopeless 0  PHQ - 2 Score 0     BP Readings from Last 3 Encounters:  04/23/24 124/82  04/02/24 130/80  06/15/21 115/77    BP 124/82   Pulse 88   Ht 5' 1 (1.549 m)   Wt 167 lb 11.2 oz (76.1 kg)   SpO2 98%   BMI 31.69 kg/m    Physical Exam          Physical Exam Constitutional:      Appearance: Normal appearance.   Neurological:     General: No focal deficit present.     Mental Status: She is alert and oriented to person, place, and time. Mental status is at baseline.     ASSESSMENT/PLAN:   Assessment & Plan Primary osteoarthritis of both knees  Tear of right meniscus as current injury, initial encounter  Adjustment disorder with mixed anxiety and depressed mood  Increased appetite  Prediabetes  Mixed hyperlipidemia    Assessment and Plan    Right knee meniscal tear and osteoarthritis Chronic right knee pain due to meniscal tear and osteoarthritis. Previous imaging from [place] not usable. Declined steroid injection due to past ineffectiveness. Discussed potential need for replacement of the replacement in 20-25 years if surgery pursued now. - Order right knee x-rays. - Refer to orthopedic surgeon for surgical evaluation. - Continue current pain medication. - Advise wearing compression sleeve. - Discuss potential need for new MRI with orthopedic surgeon.  Hyperlipidemia Hyperlipidemia management interrupted due to pharmacy error. Crestor not dispensed previously. - Restart Crestor for hyperlipidemia management.  Prediabetes Prediabetes management interrupted due to pharmacy error. Jardiance not dispensed previously. - Restart Jardiance for prediabetes management.  General Health Maintenance Requires routine physical examination and monitoring of blood glucose levels. - Schedule physical examination in three months. - Recheck blood glucose levels  during next visit.         Roosvelt Churchwell A. Vita MD [facility]

## 2024-04-23 NOTE — Patient Instructions (Addendum)
 Please go to [facility] to get your Xrays done at the following address:  [address]

## 2024-04-24 ENCOUNTER — Telehealth: Payer: Self-pay

## 2024-04-24 DIAGNOSIS — M170 Bilateral primary osteoarthritis of knee: Secondary | ICD-10-CM

## 2024-04-24 DIAGNOSIS — S83206A Unspecified tear of unspecified meniscus, current injury, right knee, initial encounter: Secondary | ICD-10-CM

## 2024-04-24 NOTE — Telephone Encounter (Signed)
 Copied from CRM #[id number]. Topic: Referral - Request for Referral >> Apr 24, 2024 12:01 PM Darshell M wrote: Did the patient discuss referral with their provider in the last year? Yes (If No - schedule appointment) (If Yes - send message)  Appointment offered? No  Type of order/referral and detailed reason for visit: Referral for imaging for right knee. Patient insurance not accepted by the original location. Patient needs  referral sent to a  different location.  Preference of office, provider, location: [facility], [address] [place], [place] [address], Phone [phone], fax# [phone]  If referral order, have you been seen by this specialty before? No (If Yes, this issue or another issue? When? Where?  Can we respond through MyChart? No

## 2024-04-24 NOTE — Addendum Note (Signed)
 Addended by: LATTIE CARLO BROCKS on: 04/24/2024 04:51 PM   Modules accepted: Orders

## 2024-04-24 NOTE — Telephone Encounter (Signed)
 done

## 2024-04-25 ENCOUNTER — Other Ambulatory Visit: Payer: Self-pay

## 2024-04-25 ENCOUNTER — Emergency Department (HOSPITAL_BASED_OUTPATIENT_CLINIC_OR_DEPARTMENT_OTHER)
Admission: EM | Admit: 2024-04-25 | Discharge: 2024-04-25 | Disposition: A | Discharge status: Home / Self Care | Attending: Emergency Medicine | Admitting: Emergency Medicine

## 2024-04-25 ENCOUNTER — Encounter (HOSPITAL_BASED_OUTPATIENT_CLINIC_OR_DEPARTMENT_OTHER): Payer: Self-pay | Admitting: *Deleted

## 2024-04-25 ENCOUNTER — Emergency Department (HOSPITAL_BASED_OUTPATIENT_CLINIC_OR_DEPARTMENT_OTHER): Admitting: Radiology

## 2024-04-25 DIAGNOSIS — M25561 Pain in right knee: Secondary | ICD-10-CM | POA: Diagnosis present

## 2024-04-25 MED ORDER — IBUPROFEN 800 MG PO TABS
800.0000 mg | ORAL_TABLET | Freq: Once | ORAL | Status: DC
  Filled 2024-04-25: qty 1

## 2024-04-25 NOTE — ED Notes (Signed)
 Reviewed AVS/discharge instruction with patient. Time allotted for and all questions answered. Patient is agreeable for d/c and escorted to ed exit by staff.

## 2024-04-25 NOTE — ED Provider Notes (Signed)
 [place] EMERGENCY DEPARTMENT AT [facility] Provider Note   CSN: [id number] Arrival date & time: 04/25/24  1607     Patient presents with: Knee Pain  HPI Montoya Watkin Nataliyah Packham is a 48 y.o. female presenting for right knee pain.  Started a year ago.  Denies trauma.  She states it is going on for too long.  Pain is primarily about the medial aspect of the knee.  Worse with walking.  Denies any notable swelling or discoloration of the knee.  Denies fever.  Reports 3 meniscal injuries in the right knee.      Knee Pain      Prior to Admission medications   Medication Sig Start Date End Date Taking? Authorizing Provider  celecoxib (CELEBREX) 100 MG capsule Take 1 capsule (100 mg total) by mouth 2 (two) times daily. 04/02/24   Jha, Panav, MD  Choline Fenofibrate 135 MG capsule Take 135 mg by mouth daily.    [provider]  empagliflozin (JARDIANCE) 25 MG TABS tablet Take 1 tablet (25 mg total) by mouth daily. 04/23/24   Jha, Panav, MD  loratadine  (CLARITIN ) 10 MG tablet Take 10 mg by mouth daily. Patient not taking: Reported on 04/02/2024    [provider]  OMEGA-3 FATTY ACIDS PO Take 1,000 mg by mouth.    [provider]  omeprazole  (PRILOSEC) 20 MG capsule TAKE 1 CAPSULE (20 MG TOTAL) BY MOUTH IN THE MORNING AND AT BEDTIME. TO REDUCE STOMACH ACID Patient not taking: Reported on 04/02/2024 05/14/20 05/14/21  Fleming, Zelda W, NP  rosuvastatin (CRESTOR) 20 MG tablet Take 1 tablet (20 mg total) by mouth daily. 04/23/24   Jha, Panav, MD  traZODone (DESYREL) 50 MG tablet Take 0.5-1 tablets (25-50 mg total) by mouth at bedtime as needed for sleep. 04/02/24   Jha, Panav, MD  gabapentin  (NEURONTIN ) 100 MG capsule Take 1 capsule (100 mg total) by mouth at bedtime. 05/14/20 06/14/20  Fleming, Zelda W, NP    Allergies: Patient has no known allergies.    Review of Systems See HPI  Updated Vital Signs BP (!) 133/97 (BP Location: Right Arm)    Pulse 87   Temp 97.9 F (36.6 C)   Resp 16   SpO2 100%   Physical Exam Constitutional:      Appearance: Normal appearance.  HENT:     Head: Normocephalic.     Nose: Nose normal.  Eyes:     Conjunctiva/sclera: Conjunctivae normal.  Pulmonary:     Effort: Pulmonary effort is normal.  Musculoskeletal:     Right knee: Normal. Normal pulse.     Left knee: Normal. Normal pulse.     Comments: Ambulatory with steady gait. Old surgical scar noted over right knee.   Neurological:     Mental Status: She is alert.  Psychiatric:        Mood and Affect: Mood normal.     (all labs ordered are listed, but only abnormal results are displayed) Labs Reviewed - No data to display  EKG: None  Radiology: DG Knee Complete 4 Views Right Result Date: 04/25/2024 CLINICAL DATA:  pain EXAM: DG KNEE COMPLETE 4+V*R* COMPARISON:  09/27/2021 FINDINGS: No acute fracture or dislocation. No joint effusion. Early degenerative osteophyte formation without significant joint space loss. Soft tissues are unremarkable. IMPRESSION: No acute fracture or dislocation. Electronically Signed   By: Rogelia Myers M.D.   On: 04/25/2024 17:38     Procedures   Medications Ordered in the ED  ibuprofen  (  ADVIL ) tablet 800 mg (800 mg Oral Patient Refused/Not Given 04/25/24 1656)                                    Medical Decision Making Amount and/or Complexity of Data Reviewed Radiology: ordered.  Risk Prescription drug management.   48 year old well-appearing female presenting for right knee pain.  Exam was unremarkable of the right knee.  Ambulatory and bearing weight with no issue.  Suspect this is chronic knee pain.  No exam findings to suggest infection or obvious trauma to the knee.  Advised RICE treatment with NSAIDs and to continue to use her knee brace as needed.      Final diagnoses:  Right knee pain, unspecified chronicity    ED Discharge Orders     None          Lang Norleen POUR,  PA-C 04/25/24 1757    Ruthe Cornet, DO 04/25/24 1820

## 2024-04-25 NOTE — ED Triage Notes (Signed)
 Pt to ED reporting right knee pain x 1 year. No recent injury or changes patient reporting she can no longer handle to pain.

## 2024-04-25 NOTE — Discharge Instructions (Addendum)
 Evaluation of his right knee was overall reassuring.  X-ray was negative for acute injury.  Please follow-up with your PCP.  In the meantime recommend supportive treatment with ibuprofen .

## 2024-04-30 ENCOUNTER — Other Ambulatory Visit (INDEPENDENT_AMBULATORY_CARE_PROVIDER_SITE_OTHER): Payer: Self-pay

## 2024-04-30 ENCOUNTER — Encounter: Payer: Self-pay | Admitting: Orthopedic Surgery

## 2024-04-30 ENCOUNTER — Ambulatory Visit (INDEPENDENT_AMBULATORY_CARE_PROVIDER_SITE_OTHER): Payer: Self-pay | Admitting: Orthopedic Surgery

## 2024-04-30 DIAGNOSIS — M25561 Pain in right knee: Secondary | ICD-10-CM

## 2024-04-30 NOTE — Progress Notes (Signed)
 Office Visit Note   Patient: Julie Herman           Date of Birth: Sep 23, 1975           MRN: [id number] Visit Date: 04/30/2024 Requested by: Vita Morrow, MD [address] [place],  [place] [address] PCP: Vita Morrow, MD  Subjective: Chief Complaint  Patient presents with   Right Knee - Pain    HPI: Nickolette Espinola is a 48 y.o. female who presents to the office reporting right knee pain longer than 1 year duration.  Denies any history of injury.  Reports constant pain.  The pain wakes her from sleep at night.  Does report swelling and weakness.  Have some popping and mechanical symptoms as well.  Old MRI scan over a year ago does demonstrate medial meniscal tear and chondral defect.  Hurts after 20 minutes of standing or walking.  She gives a distinct history of the knee locking up and then releasing and becoming ambulatory again.  Radiographs reviewed show maintenance of joint space..                ROS: All systems reviewed are negative as they relate to the chief complaint within the history of present illness.  Patient denies fevers or chills.  Assessment & Plan: Visit Diagnoses:  1. Right knee pain, unspecified chronicity     Plan: Impression is right knee pain with effusion and chondral defect with medial meniscal pathology.  The scan is over a-year-old and her symptoms have increased significantly since that time.  Would like to repeat the scan with a higher level of magnetism in order to get a better picture.  Injection has been performed which has not helped.  She has been doing activity modification as well as a home exercise program of therapeutic strengthening exercises without relief.  No personal or family history of DVT or pulmonary embolism.  Follow-Up Instructions: No follow-ups on file.   Orders:  Orders Placed This Encounter  Procedures   US  Extrem Low Right Ltd   MR Knee Right w/o contrast   No orders of the defined types were  placed in this encounter.     Procedures: No procedures performed   Clinical Data: No additional findings.  Objective: Vital Signs: There were no vitals taken for this visit.  Physical Exam:  Constitutional: Patient appears well-developed HEENT:  Head: Normocephalic Eyes:EOM are normal Neck: Normal range of motion Cardiovascular: Normal rate Pulmonary/chest: Effort normal Neurologic: Patient is alert Skin: Skin is warm Psychiatric: Patient has normal mood and affect  Ortho Exam: Ortho exam demonstrates no effusion with range of motion of 5 degrees to full flexion.  Does have slightly more medial than lateral joint line tenderness.  Extensor mechanism intact.  No masses lymphadenopathy or skin changes noted in that right knee region.  Does have a little bit of crepitus with passive range of motion and positive McMurray compression testing for medial compartment pathology.  Specialty Comments:  No specialty comments available.  Imaging: No results found.   PMFS History: Patient Active Problem List   Diagnosis Date Noted   S/P carpal tunnel release 11/26/2020   Primary osteoarthritis of first carpometacarpal joint of right hand 11/26/2020   Carpal tunnel syndrome on left 11/12/2020   Menorrhagia 04/10/2016   Language barrier 09/14/2014   Hyperlipidemia 04/10/2014   Past Medical History:  Diagnosis Date   GERD (gastroesophageal reflux disease)    Neuromuscular disorder (HCC)  Family History  Problem Relation Age of Onset   Heart disease Mother    Diabetes Brother    Diabetes Maternal Aunt    Diabetes Paternal Aunt     Past Surgical History:  Procedure Laterality Date   CARPAL TUNNEL RELEASE Right 08/13/2020   Procedure: RIGHT CARPAL TUNNEL RELEASE;  Surgeon: Jerri Kay HERO, MD;  Location: [facility];  Service: Orthopedics;  Laterality: Right;  Bier block   CARPAL TUNNEL RELEASE Left 11/12/2020   Procedure: LEFT CARPAL TUNNEL RELEASE;  Surgeon:  Jerri Kay HERO, MD;  Location: [facility];  Service: Orthopedics;  Laterality: Left;   NO PAST SURGERIES     Social History   Occupational History   Not on file  Tobacco Use   Smoking status: Never   Smokeless tobacco: Never  Substance and Sexual Activity   Alcohol use: Yes    Alcohol/week: 0.0 standard drinks of alcohol   Drug use: No   Sexual activity: Yes    Birth control/protection: None

## 2024-05-07 ENCOUNTER — Ambulatory Visit (HOSPITAL_COMMUNITY)
Admission: RE | Admit: 2024-05-07 | Discharge: 2024-05-07 | Disposition: A | Discharge status: Home / Self Care | Source: Ambulatory Visit | Attending: Orthopedic Surgery | Admitting: Orthopedic Surgery

## 2024-05-07 DIAGNOSIS — M25561 Pain in right knee: Secondary | ICD-10-CM | POA: Insufficient documentation

## 2024-05-14 ENCOUNTER — Other Ambulatory Visit: Payer: Self-pay

## 2024-05-14 ENCOUNTER — Encounter: Payer: Self-pay | Admitting: Family Medicine

## 2024-05-14 ENCOUNTER — Ambulatory Visit (INDEPENDENT_AMBULATORY_CARE_PROVIDER_SITE_OTHER): Admitting: Family Medicine

## 2024-05-14 VITALS — BP 124/78 | HR 76 | Ht 61.0 in | Wt 166.4 lb

## 2024-05-14 DIAGNOSIS — N898 Other specified noninflammatory disorders of vagina: Secondary | ICD-10-CM | POA: Diagnosis not present

## 2024-05-14 DIAGNOSIS — R350 Frequency of micturition: Secondary | ICD-10-CM | POA: Diagnosis not present

## 2024-05-14 DIAGNOSIS — R399 Unspecified symptoms and signs involving the genitourinary system: Secondary | ICD-10-CM | POA: Diagnosis not present

## 2024-05-14 LAB — POCT URINE DIPSTICK
Bilirubin, UA: NEGATIVE
Glucose, UA: >=1000 mg/dL — AB
Ketones, POC UA: NEGATIVE mg/dL
Leukocytes, UA: NEGATIVE
Nitrite, UA: NEGATIVE
POC PROTEIN,UA: NEGATIVE
Spec Grav, UA: <=1.005 — AB (ref 1.010–1.025)
Urobilinogen, UA: 0.2 U/dL
pH, UA: 6.5 (ref 5.0–8.0)

## 2024-05-14 MED ORDER — METFORMIN HCL 1000 MG PO TABS
1000.0000 mg | ORAL_TABLET | Freq: Two times a day (BID) | ORAL | 1 refills | Status: AC
  Filled 2024-05-14 (×2): qty 180, 90d supply, fill #0

## 2024-05-14 NOTE — Addendum Note (Signed)
 Addended by: LATTIE CARLO BROCKS on: 05/14/2024 10:19 AM   Modules accepted: Orders

## 2024-05-14 NOTE — Progress Notes (Signed)
   Name: Julie Herman   Date of Visit: 05/14/24   Date of last visit with me: 04/23/2024   CHIEF COMPLAINT:  Chief Complaint  Patient presents with   other    [phone] interp code- 2 weeks itching in vagina- would like bloodwork and urine labs-       HPI:  Discussed the use of AI scribe software for clinical note transcription with the patient, who gave verbal consent to proceed.  History of Present Illness Julie Herman is a 48 year old female who presents with vaginal itching and burning during urination.  She has been experiencing itching around the vaginal area, specifically on the skin on the sides, for the past two weeks. She has been using two types of creams without improvement. She is unsure if there is any redness as she has not checked.  She reports a burning sensation during urination but no associated pain. She has been in menopause for approximately nine years and experiences frequent hot flashes throughout the day and night, affecting her sleep. She describes getting 'really sweaty' and 'hot' all day.     OBJECTIVE:       04/02/2024   11:26 AM  Depression screen PHQ 2/9  Decreased Interest 0  Down, Depressed, Hopeless 0  PHQ - 2 Score 0     BP Readings from Last 3 Encounters:  05/14/24 124/78  04/25/24 (!) 133/97  04/23/24 124/82    BP 124/78   Pulse 76   Ht 5' 1 (1.549 m)   Wt 166 lb 6.4 oz (75.5 kg)   LMP  (LMP Unknown)   SpO2 98%   BMI 31.44 kg/m    Physical Exam    Physical Exam  ASSESSMENT/PLAN:   Assessment & Plan Vagina itching  Urinary tract infection symptoms  Urinary frequency    Assessment and Plan Assessment & Plan Vulvar and vaginal pruritus and genitourinary symptoms Two weeks of vulvar pruritus with burning urination. Differential includes fungal infection and UTI. Postmenopausal status may contribute due to decreased estrogen. - Await urine and swab test results. - Prescribe  antibiotic if infection confirmed. - Recommend topical antifungal if results inconclusive. - POCT UA unremarkable, await swab results. - Urinary symptoms with glucose>1000, on Jardiance - Will stop jardiance, initiate metformin  1000mg  BID.   Postmenopausal symptoms (hot flashes, pruritus) Hot flashes and pruritus likely due to postmenopausal status. Differential includes thyroid dysfunction or vitamin deficiencies. - Follow-up in 1-2 weeks for exam and blood work. - Discuss treatment options, including estrogen therapy, post blood work review.  General Health Maintenance Received flu shot and completed COVID-19 vaccination series.     Ethelyn Cerniglia A. Vita MD [facility]

## 2024-05-16 ENCOUNTER — Other Ambulatory Visit: Payer: Self-pay

## 2024-05-19 ENCOUNTER — Ambulatory Visit: Payer: Self-pay | Admitting: Family Medicine

## 2024-05-19 ENCOUNTER — Other Ambulatory Visit: Payer: Self-pay

## 2024-05-19 DIAGNOSIS — B379 Candidiasis, unspecified: Secondary | ICD-10-CM

## 2024-05-19 LAB — NUSWAB VAGINITIS PLUS (VG+)
Candida albicans, NAA: POSITIVE — AB
Candida glabrata, NAA: NEGATIVE

## 2024-05-19 MED ORDER — FLUCONAZOLE 150 MG PO TABS
150.0000 mg | ORAL_TABLET | Freq: Once | ORAL | 0 refills | Status: AC
  Filled 2024-05-19: qty 1, 1d supply, fill #0

## 2024-05-20 ENCOUNTER — Other Ambulatory Visit: Payer: Self-pay

## 2024-05-21 ENCOUNTER — Encounter: Payer: Self-pay | Admitting: Orthopedic Surgery

## 2024-05-21 ENCOUNTER — Ambulatory Visit: Admitting: Orthopedic Surgery

## 2024-05-21 DIAGNOSIS — S83241D Other tear of medial meniscus, current injury, right knee, subsequent encounter: Secondary | ICD-10-CM | POA: Diagnosis not present

## 2024-05-21 NOTE — Progress Notes (Signed)
 Office Visit Note   Patient: Julie Herman           Date of Birth: 1976-04-13           MRN: [id number] Visit Date: 05/21/2024 Requested by: Vita Morrow, MD [address] [place],  [place] [address] PCP: Vita Morrow, MD  Subjective: Chief Complaint  Patient presents with   Other    Follow up to review MRI     HPI: Julie Herman is a 48 y.o. female who presents to the office reporting right knee pain.  Since she was last seen has had an MRI scan.  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  Patient describes medial pain with swelling.  The pain is getting worse.  No personal or family history of DVT or pulmonary embolism..                ROS: All systems reviewed are negative as they relate to the chief complaint within the history of present illness.  Patient denies fevers or chills.  Assessment & Plan: Visit Diagnoses:  1. Acute medial meniscal tear, right, subsequent encounter     Plan: Impression is symptomatic right knee medial meniscal tear.  Patient has failed conservative management.  This includes injection activity modification and physical therapy type exercises.  Plan at this time is right knee arthroscopy and meniscal debridement.  The risks and benefits are discussed with the patient through the interpreter.  They include but not limited to infection nerve or vessel damage incomplete pain relief in the rehabilitation process is also discussed.  I think in general this is a good operation but not a perfect operation and she may likely have some residual symptoms but overall should be improved compared to where she is now.  Patient understands and wishes to proceed.  All questions answered.  Follow-Up Instructions: No follow-ups on file.   Orders:  No orders of the defined types were placed in this encounter.  No orders of the  defined types were placed in this encounter.     Procedures: No procedures performed   Clinical Data: No additional findings.  Objective: Vital Signs: LMP  (LMP Unknown)   Physical Exam:  Constitutional: Patient appears well-developed HEENT:  Head: Normocephalic Eyes:EOM are normal Neck: Normal range of motion Cardiovascular: Normal rate Pulmonary/chest: Effort normal Neurologic: Patient is alert Skin: Skin is warm Psychiatric: Patient has normal mood and affect  Ortho Exam: Ortho exam demonstrates medial greater than lateral joint line tenderness on the right-hand side.  Range of motion is full.  Trace effusion present.  Positive Murray compression testing on the right.  No groin pain with internal/external Tatian of leg.  No other masses lymphadenopathy or skin changes noted in that right leg or knee region.  Specialty Comments:  No specialty comments available.  Imaging: No results found.   PMFS History: Patient Active Problem List   Diagnosis Date Noted   S/P carpal tunnel release 11/26/2020   Primary osteoarthritis of first carpometacarpal joint of right hand 11/26/2020   Carpal tunnel syndrome on left 11/12/2020   Menorrhagia 04/10/2016   Language barrier 09/14/2014   Hyperlipidemia 04/10/2014   Past Medical History:  Diagnosis Date   GERD (gastroesophageal reflux disease)    Neuromuscular disorder (HCC)     Family History  Problem Relation Age of Onset   Heart disease Mother  Diabetes Brother    Diabetes Maternal Aunt    Diabetes Paternal Aunt     Past Surgical History:  Procedure Laterality Date   CARPAL TUNNEL RELEASE Right 08/13/2020   Procedure: RIGHT CARPAL TUNNEL RELEASE;  Surgeon: Jerri Kay HERO, MD;  Location: [facility];  Service: Orthopedics;  Laterality: Right;  Bier block   CARPAL TUNNEL RELEASE Left 11/12/2020   Procedure: LEFT CARPAL TUNNEL RELEASE;  Surgeon: Jerri Kay HERO, MD;  Location: [facility];   Service: Orthopedics;  Laterality: Left;   NO PAST SURGERIES     Social History   Occupational History   Not on file  Tobacco Use   Smoking status: Never   Smokeless tobacco: Never  Substance and Sexual Activity   Alcohol use: Yes    Alcohol/week: 0.0 standard drinks of alcohol   Drug use: No   Sexual activity: Yes    Birth control/protection: None

## 2024-05-28 ENCOUNTER — Ambulatory Visit: Admitting: Orthopedic Surgery

## 2024-06-16 ENCOUNTER — Encounter: Admitting: Orthopedic Surgery

## 2024-06-18 ENCOUNTER — Telehealth: Payer: Self-pay | Admitting: Orthopedic Surgery

## 2024-06-18 NOTE — Telephone Encounter (Signed)
 Called patient to discuss surgery date after message was left on voicemail stating surgery at [facility] had been cancelled on 06/09/24 due to patient's coverage not being in network with the facility.  Patient would like to reschedule after the first of the year.  Patient's insurance is changing but she has not received any information about the coverage.  Patient was given my name and direct number for scheduling and will call once they have the name and information for the new insurance coverage.

## 2024-07-16 ENCOUNTER — Other Ambulatory Visit: Payer: Self-pay

## 2024-07-16 ENCOUNTER — Telehealth: Payer: Self-pay | Admitting: Orthopedic Surgery

## 2024-07-16 NOTE — Telephone Encounter (Signed)
 Okay for PT order but need re-eval afterward (6-8 weeks after start of PT)

## 2024-07-16 NOTE — Telephone Encounter (Signed)
 Pt's husband came in wanting a referral sent to [address] in [place]. Fax number is [phone]. Phone number is [phone]. Call back number is [phone]

## 2024-07-17 ENCOUNTER — Other Ambulatory Visit: Payer: Self-pay

## 2024-07-17 DIAGNOSIS — S83241D Other tear of medial meniscus, current injury, right knee, subsequent encounter: Secondary | ICD-10-CM

## 2024-07-17 DIAGNOSIS — M25561 Pain in right knee: Secondary | ICD-10-CM

## 2024-07-17 NOTE — Telephone Encounter (Signed)
 Referral placed.

## 2024-07-18 ENCOUNTER — Telehealth: Payer: Self-pay

## 2024-07-18 NOTE — Telephone Encounter (Signed)
 I do not know of any [facility] providers.  But if you want to send them to that place it is fine to send him there but with the caveat that I do not know any of them.  So feel free to send those notes over and let them find a physician who can help him.  Thanks

## 2024-07-18 NOTE — Telephone Encounter (Signed)
-----   Message from McCarr N sent at 07/18/2024  1:50 PM EST ----- Regarding: SURGERY REFERRAL Patient is NOT looking for a referral for physical therapy. Patient's husband is requesting a referral to another orthopedic doctor in network with [facility].  Patient was scheduled for surgery 06-09-24 but had to be cancelled because [facility] did not accept the insurance.  [facility] is out of network.  Patient would like to have surgery with Dr. Addie but cannot because of the cost to remain under his care.  Patient's husband is asking if we can refer and have the notes sent so they don't feel like they are having to start over.  Patient was seen 11/24 for right knee medial meniscal tear.   Husband is Celine (he speaks English)  [phone].     THIS IS A PROVIDER WITH [facility] (AND MAY BE IN NETWORK)  [facility] - [facility] [address] [place], [place] [address] Directions [phone]

## 2024-07-22 ENCOUNTER — Other Ambulatory Visit: Payer: Self-pay

## 2024-07-22 DIAGNOSIS — S83241D Other tear of medial meniscus, current injury, right knee, subsequent encounter: Secondary | ICD-10-CM

## 2024-07-22 NOTE — Telephone Encounter (Signed)
 IC talked with Salvador. He provided a new provider that will take insurance on [address] in [place]. Referral generated. Faxed last 2 OV notes and MRI over to them as he stated they are waiting on information from us  so that they continue care for patient.

## 2024-07-24 ENCOUNTER — Encounter: Payer: Self-pay | Admitting: Family Medicine
